# Patient Record
Sex: Female | Born: 1955 | Race: White | Hispanic: No | Smoking: Former smoker
Health system: Southern US, Community
[De-identification: ages and names within clinical notes are randomized; demographics above are authoritative.]

## PROBLEM LIST (undated history)

## (undated) DIAGNOSIS — E7800 Pure hypercholesterolemia, unspecified: Secondary | ICD-10-CM

## (undated) DIAGNOSIS — T7840XA Allergy, unspecified, initial encounter: Secondary | ICD-10-CM

## (undated) DIAGNOSIS — F419 Anxiety disorder, unspecified: Secondary | ICD-10-CM

## (undated) DIAGNOSIS — K219 Gastro-esophageal reflux disease without esophagitis: Secondary | ICD-10-CM

## (undated) DIAGNOSIS — L409 Psoriasis, unspecified: Secondary | ICD-10-CM

## (undated) DIAGNOSIS — M1990 Unspecified osteoarthritis, unspecified site: Secondary | ICD-10-CM

## (undated) DIAGNOSIS — F329 Major depressive disorder, single episode, unspecified: Secondary | ICD-10-CM

## (undated) HISTORY — DX: Allergy, unspecified, initial encounter: T78.40XA

## (undated) HISTORY — DX: Unspecified osteoarthritis, unspecified site: M19.90

## (undated) HISTORY — DX: Anxiety disorder, unspecified: F41.9

## (undated) HISTORY — PX: CHOLECYSTECTOMY: SHX55

## (undated) HISTORY — PX: HAND SURGERY: SHX662

## (undated) HISTORY — PX: CHOLECYSTECTOMY, LAPAROSCOPIC: SHX56

## (undated) HISTORY — PX: FOOT SURGERY: SHX648

---
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
## 2003-12-12 ENCOUNTER — Other Ambulatory Visit: Admission: RE | Admit: 2003-12-12 | Discharge: 2003-12-12 | Payer: Self-pay | Admitting: Obstetrics and Gynecology

## 2004-12-25 ENCOUNTER — Other Ambulatory Visit: Admission: RE | Admit: 2004-12-25 | Discharge: 2004-12-25 | Payer: Self-pay | Admitting: Obstetrics and Gynecology

## 2015-10-28 DIAGNOSIS — M6740 Ganglion, unspecified site: Secondary | ICD-10-CM | POA: Insufficient documentation

## 2015-10-28 DIAGNOSIS — L409 Psoriasis, unspecified: Secondary | ICD-10-CM | POA: Insufficient documentation

## 2015-10-28 DIAGNOSIS — E7800 Pure hypercholesterolemia, unspecified: Secondary | ICD-10-CM | POA: Insufficient documentation

## 2015-10-28 DIAGNOSIS — E669 Obesity, unspecified: Secondary | ICD-10-CM | POA: Insufficient documentation

## 2015-10-28 DIAGNOSIS — K219 Gastro-esophageal reflux disease without esophagitis: Secondary | ICD-10-CM

## 2015-10-28 DIAGNOSIS — F329 Major depressive disorder, single episode, unspecified: Secondary | ICD-10-CM | POA: Insufficient documentation

## 2015-10-28 DIAGNOSIS — E785 Hyperlipidemia, unspecified: Secondary | ICD-10-CM | POA: Insufficient documentation

## 2015-10-28 DIAGNOSIS — R748 Abnormal levels of other serum enzymes: Secondary | ICD-10-CM | POA: Insufficient documentation

## 2015-10-28 HISTORY — DX: Pure hypercholesterolemia, unspecified: E78.00

## 2015-10-28 HISTORY — DX: Gastro-esophageal reflux disease without esophagitis: K21.9

## 2015-10-28 HISTORY — DX: Psoriasis, unspecified: L40.9

## 2015-10-28 HISTORY — DX: Major depressive disorder, single episode, unspecified: F32.9

## 2015-10-29 DIAGNOSIS — Z0000 Encounter for general adult medical examination without abnormal findings: Secondary | ICD-10-CM | POA: Insufficient documentation

## 2016-04-28 ENCOUNTER — Ambulatory Visit (INDEPENDENT_AMBULATORY_CARE_PROVIDER_SITE_OTHER): Payer: Managed Care, Other (non HMO) | Admitting: Podiatry

## 2016-04-28 ENCOUNTER — Encounter: Payer: Self-pay | Admitting: Podiatry

## 2016-04-28 ENCOUNTER — Ambulatory Visit (HOSPITAL_BASED_OUTPATIENT_CLINIC_OR_DEPARTMENT_OTHER)
Admission: RE | Admit: 2016-04-28 | Discharge: 2016-04-28 | Disposition: A | Discharge status: Home / Self Care | Payer: Managed Care, Other (non HMO) | Source: Ambulatory Visit | Attending: Podiatry | Admitting: Podiatry

## 2016-04-28 DIAGNOSIS — M779 Enthesopathy, unspecified: Secondary | ICD-10-CM | POA: Diagnosis not present

## 2016-04-28 DIAGNOSIS — M2142 Flat foot [pes planus] (acquired), left foot: Secondary | ICD-10-CM | POA: Diagnosis not present

## 2016-04-28 DIAGNOSIS — M2141 Flat foot [pes planus] (acquired), right foot: Secondary | ICD-10-CM | POA: Insufficient documentation

## 2016-04-28 DIAGNOSIS — M19071 Primary osteoarthritis, right ankle and foot: Secondary | ICD-10-CM | POA: Diagnosis not present

## 2016-04-28 DIAGNOSIS — M899 Disorder of bone, unspecified: Secondary | ICD-10-CM | POA: Insufficient documentation

## 2016-04-28 DIAGNOSIS — R52 Pain, unspecified: Secondary | ICD-10-CM

## 2016-04-28 NOTE — Progress Notes (Signed)
   Subjective:    Patient ID: Caitlin Pitts, female    DOB: 03/27/1956, 60 y.o.   MRN: [id number]  HPI  60 year old female presents the also concerns her right ankle pain which didn't ongoing for about 10 years and she is pretty cc and 3 other doctors for this issue. She states of the first doctor recommend reconstructive surgery. The second Dr. Jeani Hawkingurney Aircast. She also had a steroid injection which she states that helps some as well as anti-inflammatories. She is also tried orthotics and she states that she does not want to do any further orthotics that she she could not fit correctly and they rub her skin. She does state that she has a history several years ago when her foot since that is when she had the onset of pain about 10 years ago. She denies having any history of an MRI. She has had neuroma surgery in 2015 and she states that she feels that she is walking on rocks.  Review of Systems  All other systems reviewed and are negative.      Objective:   Physical Exam General: AAO x3, NAD  Dermatological: Skin is warm, dry and supple bilateral. Nails x 10 are well manicured; remaining integument appears unremarkable at this time. There are no open sores, no preulcerative lesions, no rash or signs of infection present.  Vascular: Dorsalis Pedis artery and Posterior Tibial artery pedal pulses are 2/4 bilateral with immedate capillary fill time. Pedal hair growth present. There is no pain with calf compression, swelling, warmth, erythema.   Neruologic: Grossly intact via light touch bilateral. Vibratory intact via tuning fork bilateral. Protective threshold with Semmes Wienstein monofilament intact to all pedal sites bilateral.   Musculoskeletal: There is mild tenderness on the lateral aspect of the sinus tarsi and the right foot and there is no discomfort with subtalar joint range of motion. Ankle joint range of motion is intact. There is a decrease in medial arch height upon weightbearing.  Equinus is present. There is no area pinpoint bony tenderness or pain the vibratory sensation. MMT 5/5. There is prominence of the metatarsal heads plantarly with atrophy of the fat pad.  Gait: Unassisted, Nonantalgic.      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 -Discussed both conservative and surgical treatment options. At this time given her ongoing symptoms and multiple treatment without relief of symptoms recommend MRI. She'll to shop around for the cheapest MRI. I gave her different location that she can call and get a quote on price as she has a high deductible and when she gets the cheapest place that she can call us and let us know we will order the MRI for her. Also written her to call her insurance company. -Dispensed metatarsal offloading pads.  Ovid CurdMatthew Wagoner, DPM

## 2016-04-28 NOTE — Patient Instructions (Signed)
If you are shopping around for the best price for an MRI check out the following places: 1. [facility] on [address] [place] 2. [facility]  3. [facility] 4. [facility] on [address] in [place]

## 2016-04-30 ENCOUNTER — Other Ambulatory Visit: Payer: Self-pay

## 2016-05-08 ENCOUNTER — Telehealth: Payer: Self-pay | Admitting: *Deleted

## 2016-05-08 DIAGNOSIS — R52 Pain, unspecified: Secondary | ICD-10-CM

## 2016-05-08 DIAGNOSIS — M779 Enthesopathy, unspecified: Secondary | ICD-10-CM

## 2016-05-08 DIAGNOSIS — M2142 Flat foot [pes planus] (acquired), left foot: Secondary | ICD-10-CM

## 2016-05-08 DIAGNOSIS — M2141 Flat foot [pes planus] (acquired), right foot: Secondary | ICD-10-CM

## 2016-05-08 DIAGNOSIS — M19071 Primary osteoarthritis, right ankle and foot: Secondary | ICD-10-CM

## 2016-05-08 NOTE — Telephone Encounter (Addendum)
Pt states she would like to have her MRI at [facility] in [place], after shopping for prices all facilities are about the same cost. I told pt I would order at [facility] and once the pre-cert was completed she could schedule. Pt states understanding. Dr. Ardelle AntonWagoner ordered MRI of Right foot and ankle. Orders given to D. [facility]ows for [employer]. 05/29/2016-Pt called for instructions after having MRI. I spoke with [facility], she states finalized report can be faxed today.

## 2016-06-09 ENCOUNTER — Ambulatory Visit (INDEPENDENT_AMBULATORY_CARE_PROVIDER_SITE_OTHER): Payer: Managed Care, Other (non HMO) | Admitting: Podiatry

## 2016-06-09 ENCOUNTER — Encounter: Payer: Self-pay | Admitting: Podiatry

## 2016-06-09 DIAGNOSIS — M25571 Pain in right ankle and joints of right foot: Secondary | ICD-10-CM | POA: Diagnosis not present

## 2016-06-09 DIAGNOSIS — M899 Disorder of bone, unspecified: Secondary | ICD-10-CM

## 2016-06-09 DIAGNOSIS — M779 Enthesopathy, unspecified: Secondary | ICD-10-CM | POA: Diagnosis not present

## 2016-06-09 DIAGNOSIS — M19071 Primary osteoarthritis, right ankle and foot: Secondary | ICD-10-CM | POA: Diagnosis not present

## 2016-06-09 DIAGNOSIS — M949 Disorder of cartilage, unspecified: Secondary | ICD-10-CM

## 2016-06-09 NOTE — Progress Notes (Signed)
Subjective: 60 year old female presents the office today for follow-up evaluation discussed MRI results of her right foot. She states that her pain is about the same. She points the lateral aspect of the foot on the sinus tarsi which is the majority of symptoms. She states that her pain does not around she is currently not experiencing any pain. She states they seem to other physicians for this and she's had multiple treatments including previous custom orthotics to help increase her arch, physical therapy, injections, shoe changes without any significant relief of symptoms. The injections did help her daily Lasix for a couple weeks. She points was tarsi where she is the majority of symptoms. Denies any systemic complaints such as fevers, chills, nausea, vomiting. No acute changes since last appointment, and no other complaints at this time.   Objective: AAO x3, NAD DP/PT pulses palpable bilaterally, CRT less than 3 seconds This tenderness palpation along the lateral aspect of the right foot on the sinus tarsi. There is no pain or crepitation subtalar joint range of motion today. There is no pain with ankle joint is no restriction with ankle joint range of motion. There is no pain of the posterior aspect of the foot with range of motion as well. There is no other specific area pinpoint bony tenderness or pain the vibratory sensation. There is a decrease in medial arch height upon weightbearing. No other areas of tenderness identified at this time. No open lesions or pre-ulcerative lesions.  No pain with calf compression, swelling, warmth, erythema  Assessment: 60 year old female with right foot sinus tarsi syndrome/capsulitis  Plan: -All treatment options discussed with the patient including all alternatives, risks, complications.  -MRI results were discussed and reviewed with the patient. -This point discussed both conservative and surgical treatment options. I am not convinced that doing ankle  arthroscopy and removing the os trigonum is wanting to limit her symptoms and based on the discussion with her she does feel the same way. Discussed with her other treatment options. I believe that she would likely benefit from an orthotic with a deep heel cup to help control her rear foot motion. She states that she when she wears a shoe that controls her heel that does better. I discussed with her possible Mezzo brace but she was hesitant. We will go ahead and do a custom molded insert with a deep heel cup to help control her rear foot motion. She was scanned for orthotics and [facility]. She agrees with plan. -Patient encouraged to call the office with any questions, concerns, change in symptoms.   MRI results (preformed at [facility]): -Findings consistent with posterior ankle impingement, os trigonum syndrome -Prominent osteochondral lesion the lateral talar dome with associated subchondral cyst formation and marrow edema. Subchondral bone of the lesion is flattened. No fragments stability. -Subtalar joint osteoarthritis -Sinus tarsi syndrome   Ovid CurdMatthew Wagoner, DPM

## 2016-06-17 ENCOUNTER — Encounter: Payer: Self-pay | Admitting: Podiatry

## 2016-07-07 ENCOUNTER — Encounter: Payer: Self-pay | Admitting: Podiatry

## 2016-07-07 ENCOUNTER — Ambulatory Visit (INDEPENDENT_AMBULATORY_CARE_PROVIDER_SITE_OTHER): Payer: Managed Care, Other (non HMO) | Admitting: Podiatry

## 2016-07-07 DIAGNOSIS — M949 Disorder of cartilage, unspecified: Secondary | ICD-10-CM

## 2016-07-07 DIAGNOSIS — M899 Disorder of bone, unspecified: Secondary | ICD-10-CM

## 2016-07-07 DIAGNOSIS — M779 Enthesopathy, unspecified: Secondary | ICD-10-CM

## 2016-07-07 DIAGNOSIS — M19071 Primary osteoarthritis, right ankle and foot: Secondary | ICD-10-CM

## 2016-07-07 NOTE — Patient Instructions (Signed)

## 2016-07-07 NOTE — Progress Notes (Signed)
Patient presents today to [facility]. Declined appointment by myself and was seen by Hadley PenLisa Cox, CMA. Oral and written break-in instructions discussed. Follow-up in 4 weeks.

## 2017-11-08 ENCOUNTER — Encounter: Payer: Self-pay | Admitting: Podiatry

## 2017-11-08 ENCOUNTER — Ambulatory Visit (INDEPENDENT_AMBULATORY_CARE_PROVIDER_SITE_OTHER): Payer: Managed Care, Other (non HMO) | Admitting: Podiatry

## 2017-11-08 ENCOUNTER — Ambulatory Visit (INDEPENDENT_AMBULATORY_CARE_PROVIDER_SITE_OTHER): Payer: Managed Care, Other (non HMO)

## 2017-11-08 DIAGNOSIS — M19071 Primary osteoarthritis, right ankle and foot: Secondary | ICD-10-CM | POA: Diagnosis not present

## 2017-11-08 DIAGNOSIS — M779 Enthesopathy, unspecified: Secondary | ICD-10-CM | POA: Diagnosis not present

## 2017-11-08 MED ORDER — METHYLPREDNISOLONE 4 MG PO TBPK: ORAL_TABLET | ORAL | 0 refills | Status: DC

## 2017-11-09 ENCOUNTER — Telehealth: Payer: Self-pay | Admitting: Podiatry

## 2017-11-09 NOTE — Telephone Encounter (Signed)
I saw Dr. Ardelle AntonWagoner yesterday and I have some questions. He mentioned a brace and he did not give me one so I don't know what to get. Also, I was wondering if there was anyway he would be able to know if this was psoriatic arthritis or just plain arthritis? The best number to reach me is [phone]. Thank you.

## 2017-11-10 NOTE — Telephone Encounter (Signed)
We discussed a tri lock for the left foot while it was inflamed. She may have one for the other side. Not sure why she didn't get it.   I do not think this is necessarily related to the psoriatic arthritis but it is hard to determine if it is psoratic arthritis vs. Osteoarthritis. We can see this type of arthritis in general and is not associated with an autoimmune disorder.

## 2017-11-10 NOTE — Telephone Encounter (Signed)
I discussed trilock brace and that I would be happy to help fit and discuss use. I told pt of Dr.Wagoner's observation concerning psoriatic arthritis and general arthritis. Pt states she was just curious since she has psoriasis.

## 2017-11-10 NOTE — Progress Notes (Addendum)
Subjective: Lorel Monacoambria presents the office today for concerns of the left foot starting to cause pain to the right foot was.  She states that she still gets pain to the right foot as well pain in the left foot is starting more recently.  She denies any recent injury or trauma she denies any swelling or redness.  She states that she has had minimal swelling.  She is been wearing the inserts but she states that she stopped as they were not helping.  She has no other concerns today. Denies any systemic complaints such as fevers, chills, nausea, vomiting. No acute changes since last appointment, and no other complaints at this time.   Objective: AAO x3, NAD DP/PT pulses palpable bilaterally, CRT less than 3 seconds There is tenderness in the left foot on lateral aspect the foot on sinus tarsi.  There is no pain or crepitation with MPJ range of motion.  There is very minimal edema there is no erythema or increase in warmth.  No pain with ankle joint range of motion.  No area pinpoint bony tenderness pain to vibratory sensation bilaterally.  She still has tenderness on the right lateral foot on sinus tarsi the left side is more symptomatic today. There is no erythema or warmth.  No open lesions or pre-ulcerative lesions.  No pain with calf compression, swelling, warmth, erythema  Assessment: Capsulitis subtalar joint bilaterally  Plan: -All treatment options discussed with the patient including all alternatives, risks, complications.  -X-rays were obtained and reviewed in the left foot.  Mild arthritic changes to the subtalar joint.  There is no evidence of acute fracture or stress fracture identified today. -She has a steroid injection she wishes.  Prescribed a Medrol Dosepak.  Also discussed her orthotics.  I want her to bring the orthotics back and see if we can modify the become more comfortable for her and that the inside of her shoes.  Also discussed physical therapy and prescription for [facility]  physical therapy was written today. Discussed an ankle brace on the left foot.  -Patient encouraged to call the office with any questions, concerns, change in symptoms.   Vivi BarrackMatthew R Galadriel Shroff DPM

## 2017-12-06 DIAGNOSIS — M779 Enthesopathy, unspecified: Secondary | ICD-10-CM | POA: Diagnosis not present

## 2017-12-06 NOTE — Telephone Encounter (Signed)
I fitted pt in TriLock brace 9 1/2 -10 size for the right foot, without sock, and instructed pt to wear the brace with socks.

## 2017-12-23 ENCOUNTER — Ambulatory Visit (INDEPENDENT_AMBULATORY_CARE_PROVIDER_SITE_OTHER): Payer: Managed Care, Other (non HMO) | Admitting: Podiatry

## 2017-12-23 ENCOUNTER — Encounter: Payer: Self-pay | Admitting: Podiatry

## 2017-12-23 DIAGNOSIS — M7751 Other enthesopathy of right foot: Secondary | ICD-10-CM

## 2017-12-23 DIAGNOSIS — M899 Disorder of bone, unspecified: Secondary | ICD-10-CM

## 2017-12-23 DIAGNOSIS — M779 Enthesopathy, unspecified: Secondary | ICD-10-CM

## 2017-12-23 DIAGNOSIS — M949 Disorder of cartilage, unspecified: Secondary | ICD-10-CM | POA: Diagnosis not present

## 2017-12-23 NOTE — Patient Instructions (Signed)

## 2017-12-27 ENCOUNTER — Telehealth: Payer: Self-pay | Admitting: *Deleted

## 2017-12-27 NOTE — Telephone Encounter (Signed)
"  I need to schedule my surgery with Dr. Ardelle Anton, please."  Do you have a date in mind?  "I'd like to do it after the fourth of July if possible."  That date is available.  I will get the surgery scheduled.  You can register on-line with the surgery center.  Choose a method of communication you would like to use between you and the surgery center staff.  It can be via email, text message, or a phone call.  "Alright I will.  Thank you so much."

## 2017-12-27 NOTE — Progress Notes (Signed)
Subjective: 62 year old female presents the office today for concerns of ongoing chronic ankle pain.  She states that she had similar pains before she started seeing me and this is been ongoing for couple years.  At this point she like to proceed with surgery to going to look at the ankle joint to see what is going on and she is had ongoing chronic pain to the ankle joint.  She has pain mostly to the anterior lateral aspect of the ankle joint where she points to.  She is attempted offloading, bracing, orthotics as well as steroids without any significant improvement. Denies any systemic complaints such as fevers, chills, nausea, vomiting. No acute changes since last appointment, and no other complaints at this time.   Objective: AAO x3, NAD DP/PT pulses palpable bilaterally, CRT less than 3 seconds There is tenderness to palpation to the anterior lateral aspect of her ankle on the anterior ankle joint line.  There is also mild discomfort on the sinus tarsi which continues.  There is mild edema to the lateral aspect of the ankle.  There is no other areas of tenderness.  There is no pain or crepitation with ankle joint range of motion.  No open lesions or pre-ulcerative lesions.  No pain with calf compression, swelling, warmth, erythema  Assessment: Osteochondral lesion of the right ankle, subtalar joint capsulitis  Plan: -All treatment options discussed with the patient including all alternatives, risks, complications.  -I reviewed the MRI with her from prior exam.  Also discussed the new MRI but she wishes to hold off on this and at this point I do not think is to change the surgery.  We discussed with her ankle arthroscopy with microfracture of the ankle joint.  We discussed the surgery as well as the postoperative course.  We had a long discussion in regard to this may not limiting her symptoms she may have ongoing symptoms or pain or even worsening of pain after the surgery and she understands this  and she wished to proceed. -The incision placement as well as the postoperative course was discussed with the patient. I discussed risks of the surgery which include, but not limited to, infection, bleeding, pain, swelling, need for further surgery, delayed or nonhealing, painful or ugly scar, numbness or sensation changes, over/under correction, recurrence, transfer lesions, further deformity, hardware failure, DVT/PE, loss of toe/foot. Patient understands these risks and wishes to proceed with surgery. The surgical consent was reviewed with the patient all 3 pages were signed. No promises or guarantees were given to the outcome of the procedure. All questions were answered to the best of my ability. Before the surgery the patient was encouraged to call the office if there is any further questions. The surgery will be performed at the [facility] on an outpatient basis. -Cam boot was dispensed for postoperative use -Patient encouraged to call the office with any questions, concerns, change in symptoms.   Vivi Barrack DPM

## 2018-02-11 ENCOUNTER — Telehealth: Payer: Self-pay | Admitting: *Deleted

## 2018-02-11 NOTE — Telephone Encounter (Signed)
Pt's post op visits have been cancelled.

## 2018-02-11 NOTE — Telephone Encounter (Signed)
"  I'm really faced with a predicament.  My ankle is feeling so much better.  I wore the brace consistently for six weeks as instructed.  My foot feels so much better.  I don't wear it that much anymore, just occasionally.  I want to cancel my surgery and see how it goes.  Is that okay?"  It's great that your ankle is feeling better.  Our main concern is to ensure you are improving.  If you feel your ankle is starting to flare up again, we can conservatively treat you.  So give us a call if you have any problems.   "Can I bring the boot back?"  You can bring it back as long as it hasn't been used.  "I haven't worn it, it's still in the plastic back that it came in.  Thank you so much for your help."  I called Renee at the surgical center and canceled the surgery.

## 2018-02-28 ENCOUNTER — Encounter: Payer: Managed Care, Other (non HMO) | Admitting: Podiatry

## 2018-03-10 ENCOUNTER — Encounter: Payer: Managed Care, Other (non HMO) | Admitting: Podiatry

## 2019-03-01 ENCOUNTER — Encounter: Payer: Self-pay | Admitting: Family Medicine

## 2019-03-01 ENCOUNTER — Ambulatory Visit (INDEPENDENT_AMBULATORY_CARE_PROVIDER_SITE_OTHER): Payer: Managed Care, Other (non HMO) | Admitting: Family Medicine

## 2019-03-01 DIAGNOSIS — E669 Obesity, unspecified: Secondary | ICD-10-CM

## 2019-03-01 DIAGNOSIS — L409 Psoriasis, unspecified: Secondary | ICD-10-CM

## 2019-03-01 DIAGNOSIS — F3341 Major depressive disorder, recurrent, in partial remission: Secondary | ICD-10-CM | POA: Diagnosis not present

## 2019-03-01 NOTE — Assessment & Plan Note (Signed)
-  Current flare managed by dermatology with increase in MTX.

## 2019-03-01 NOTE — Assessment & Plan Note (Signed)
-  Stable with current medications, will plan to continue.  

## 2019-03-01 NOTE — Progress Notes (Signed)
Caitlin Pitts - 63 y.o. female MRN [id number]  Date of birth: 07/26/1956  Subjective Chief Complaint  Patient presents with  . Establish Care    NP/ depression/Anciety/Psorisis flar up/Obesity    HPI Caitlin Pitts is a 63 y.o. female with history of psoriasis, depression and anxiety here today for initial visit with new pcp.  She reports that she is having a flare of psoriasis but otherwise is doing ok.  She is followed by Dr. Tamala Julian at [facility].  She increases her methotrexate when she has a flare of psoriasis.    Reports struggling with weight for several years.  Has tried phentermine but had severe palpitations with this.  Hasn't had much improvement with bupropion.  She admits to fairly sedentary lifestyle.    Her anxiety and depression are well managed with bupropion and lexapro.  She does take valium as needed if flying.    ROS:  A comprehensive ROS was completed and negative except as noted per HPI  Allergies  Allergen Reactions  . Aspirin Swelling  . Desvenlafaxine Rash  . Penicillins Rash  . Phentermine Rash    History reviewed. No pertinent past medical history.  History reviewed. No pertinent surgical history.  Social History   Socioeconomic History  . Marital status: Divorced    Spouse name: Not on file  . Number of children: Not on file  . Years of education: Not on file  . Highest education level: Not on file  Occupational History  . Not on file  Social Needs  . Financial resource strain: Not on file  . Food insecurity    Worry: Not on file    Inability: Not on file  . Transportation needs    Medical: Not on file    Non-medical: Not on file  Tobacco Use  . Smoking status: Former Research scientist (life sciences)  . Smokeless tobacco: Never Used  Substance and Sexual Activity  . Alcohol use: Not Currently  . Drug use: Never  . Sexual activity: Not Currently  Lifestyle  . Physical activity    Days per week: Not on file    Minutes per session: Not on file   . Stress: Not on file  Relationships  . Social Herbalist on phone: Not on file    Gets together: Not on file    Attends religious service: Not on file    Active member of club or organization: Not on file    Attends meetings of clubs or organizations: Not on file    Relationship status: Not on file  Other Topics Concern  . Not on file  Social History Narrative  . Not on file    Family History  Problem Relation Age of Onset  . Migraines Mother   . Hypercholesterolemia Mother     Health Maintenance  Topic Date Due  . Hepatitis C Screening  08/29/55  . HIV Screening  03/21/1971  . TETANUS/TDAP  03/21/1975  . PAP SMEAR-Modifier  03/20/1977  . MAMMOGRAM  03/20/2006  . COLONOSCOPY  03/20/2006  . INFLUENZA VACCINE  03/18/2019    ----------------------------------------------------------------------------------------------------------------------------------------------------------------------------------------------------------------- Physical Exam BP 98/72   Pulse 76   Temp 97.9 F (36.6 C) (Oral)   Resp 16   Ht 5' 4.25" (1.632 m)   Wt 225 lb (102.1 kg)   SpO2 98%   BMI 38.32 kg/m   Physical Exam Constitutional:      General: She is not in acute distress.    Appearance: She is  obese.  HENT:     Head: Normocephalic and atraumatic.     Mouth/Throat:     Mouth: Mucous membranes are moist.  Eyes:     General: No scleral icterus. Neck:     Musculoskeletal: Neck supple.  Cardiovascular:     Rate and Rhythm: Normal rate and regular rhythm.  Pulmonary:     Effort: Pulmonary effort is normal.     Breath sounds: Normal breath sounds.  Skin:    General: Skin is warm and dry.  Neurological:     General: No focal deficit present.     Mental Status: She is alert.  Psychiatric:        Mood and Affect: Mood normal.        Behavior: Behavior normal.      ------------------------------------------------------------------------------------------------------------------------------------------------------------------------------------------------------------------- Assessment and Plan  Psoriasis -Current flare managed by dermatology with increase in MTX.    Major depression -Stable with current medications, will plan to continue.   Obesity (BMI 35.0-39.9 without comorbidity) -Recommend increased exercise starting with walking as well as a diet rich in fruits and vegetables and low in processed foods.

## 2019-03-01 NOTE — Patient Instructions (Addendum)
-  Great to meet you! -Please follow up with me for an annual exam.

## 2019-03-01 NOTE — Assessment & Plan Note (Signed)
-  Recommend increased exercise starting with walking as well as a diet rich in fruits and vegetables and low in processed foods.

## 2019-03-17 ENCOUNTER — Telehealth: Payer: Self-pay

## 2019-03-17 NOTE — Telephone Encounter (Signed)
Questions for Screening COVID-19  Symptom onset: n/a  Travel or Contacts: no  During this illness, did/does the patient experience any of the following symptoms? Fever >100.30F []   Yes [x]   No []   Unknown Subjective fever (felt feverish) []   Yes [x]   No []   Unknown Chills []   Yes [x]   No []   Unknown Muscle aches (myalgia) []   Yes [x]   No []   Unknown Runny nose (rhinorrhea) []   Yes [x]   No []   Unknown Sore throat []   Yes [x]   No []   Unknown Cough (new onset or worsening of chronic cough) []   Yes [x]   No []   Unknown Shortness of breath (dyspnea) []   Yes []   No []   Unknown Nausea or vomiting []   Yes [x]   No []   Unknown Headache []   Yes [x]   No []   Unknown Abdominal pain  []   Yes [x]   No []   Unknown Diarrhea (?3 loose/looser than normal stools/24hr period) []   Yes [x]   No []   Unknown Other, specify:  Patient risk factors: Smoker? []   Current []   Former []   Never If female, currently pregnant? []   Yes []   No  Patient Active Problem List   Diagnosis Date Noted  . Annual physical exam 10/29/2015  . Elevated lipase 10/28/2015  . Esophageal reflux 10/28/2015  . Ganglion of tendon sheath 10/28/2015  . Major depression 10/28/2015  . Obesity (BMI 35.0-39.9 without comorbidity) 10/28/2015  . Psoriasis 10/28/2015  . Pure hypercholesterolemia 10/28/2015    Plan:  []   High risk for COVID-19 with red flags go to ED (with CP, SOB, weak/lightheaded, or fever > 101.5). Call ahead.  []   High risk for COVID-19 but stable. Inform provider and coordinate time for [facility] visit.   []   No red flags but URI signs or symptoms okay for [facility] visit.

## 2019-03-20 ENCOUNTER — Other Ambulatory Visit: Payer: Self-pay

## 2019-03-20 ENCOUNTER — Ambulatory Visit (INDEPENDENT_AMBULATORY_CARE_PROVIDER_SITE_OTHER): Payer: Managed Care, Other (non HMO)

## 2019-03-20 ENCOUNTER — Encounter: Payer: Self-pay | Admitting: Family Medicine

## 2019-03-20 ENCOUNTER — Ambulatory Visit (INDEPENDENT_AMBULATORY_CARE_PROVIDER_SITE_OTHER): Payer: Managed Care, Other (non HMO) | Admitting: Family Medicine

## 2019-03-20 VITALS — BP 116/82 | HR 63 | Temp 97.7°F | Ht 64.25 in | Wt 225.2 lb

## 2019-03-20 DIAGNOSIS — Z23 Encounter for immunization: Secondary | ICD-10-CM | POA: Diagnosis not present

## 2019-03-20 DIAGNOSIS — M25561 Pain in right knee: Secondary | ICD-10-CM

## 2019-03-20 DIAGNOSIS — E7800 Pure hypercholesterolemia, unspecified: Secondary | ICD-10-CM

## 2019-03-20 DIAGNOSIS — Z0000 Encounter for general adult medical examination without abnormal findings: Secondary | ICD-10-CM

## 2019-03-20 DIAGNOSIS — M23205 Derangement of unspecified medial meniscus due to old tear or injury, unspecified knee: Secondary | ICD-10-CM | POA: Insufficient documentation

## 2019-03-20 LAB — CBC WITH DIFFERENTIAL/PLATELET
Basophils Absolute: 0 10*3/uL (ref 0.0–0.1)
Basophils Relative: 1.1 % (ref 0.0–3.0)
Eosinophils Absolute: 0.1 10*3/uL (ref 0.0–0.7)
Eosinophils Relative: 3.2 % (ref 0.0–5.0)
HCT: 36.9 % (ref 36.0–46.0)
Hemoglobin: 12.5 g/dL (ref 12.0–15.0)
Lymphocytes Relative: 40.9 % (ref 12.0–46.0)
Lymphs Abs: 1.8 10*3/uL (ref 0.7–4.0)
MCHC: 33.8 g/dL (ref 30.0–36.0)
MCV: 96.1 fl (ref 78.0–100.0)
Monocytes Absolute: 0.2 10*3/uL (ref 0.1–1.0)
Monocytes Relative: 5.2 % (ref 3.0–12.0)
Neutro Abs: 2.2 10*3/uL (ref 1.4–7.7)
Neutrophils Relative %: 49.6 % (ref 43.0–77.0)
Platelets: 210 10*3/uL (ref 150.0–400.0)
RBC: 3.84 Mil/uL — ABNORMAL LOW (ref 3.87–5.11)
RDW: 13.5 % (ref 11.5–15.5)
WBC: 4.3 10*3/uL (ref 4.0–10.5)

## 2019-03-20 LAB — LIPID PANEL
Cholesterol: 207 mg/dL — ABNORMAL HIGH (ref 0–200)
HDL: 63.5 mg/dL (ref >39.00)
LDL Cholesterol: 115 mg/dL — ABNORMAL HIGH (ref 0–99)
NonHDL: 143.37
Total CHOL/HDL Ratio: 3
Triglycerides: 140 mg/dL (ref 0.0–149.0)
VLDL: 28 mg/dL (ref 0.0–40.0)

## 2019-03-20 LAB — COMPREHENSIVE METABOLIC PANEL
ALT: 31 U/L (ref 0–35)
AST: 25 U/L (ref 0–37)
Albumin: 4.3 g/dL (ref 3.5–5.2)
Alkaline Phosphatase: 50 U/L (ref 39–117)
BUN: 20 mg/dL (ref 6–23)
CO2: 24 mEq/L (ref 19–32)
Calcium: 9.4 mg/dL (ref 8.4–10.5)
Chloride: 106 mEq/L (ref 96–112)
Creatinine, Ser: 0.9 mg/dL (ref 0.40–1.20)
GFR: 63.24 mL/min (ref >60.00)
Glucose, Bld: 94 mg/dL (ref 70–99)
Potassium: 4.5 mEq/L (ref 3.5–5.1)
Sodium: 138 mEq/L (ref 135–145)
Total Bilirubin: 0.5 mg/dL (ref 0.2–1.2)
Total Protein: 7.1 g/dL (ref 6.0–8.3)

## 2019-03-20 LAB — TSH: TSH: 1.42 u[IU]/mL (ref 0.35–4.50)

## 2019-03-20 NOTE — Assessment & Plan Note (Signed)
-  Xrays requested to eval for R knee pain. Discussed referral to sports med if xrays are negative.

## 2019-03-20 NOTE — Progress Notes (Signed)
Caitlin Pitts - 63 y.o. female MRN [id number]  Date of birth: 1955-11-25  Subjective Chief Complaint  Patient presents with  . Annual Exam    CPE- Not fasting/ wants tdap/ pap and mammo-jan/ colonoscopy 3 years goes every 5     HPI Caitlin Pitts is a 63 y.o. female with history of psoriasis, depression and elevated cholesterol here today for annual exam.  She complains of R knee pain today as well.  Reports that she was walking her dog a few weeks ago and when she was picking up dog poop her dog started to chase another dog causing her to twist her knee.  She feels like her knee has been swollen and a bit unstable since that time.  It feels "tight" to bend the knee.  She denies locking sensation.  In regards to health maintenance she is up to date on colon cancer screening, pap and mammogram. She is followed at [facility] by Dr. Corinna Capra for her female care.   She would like to have updated Tdap today.   Review of Systems  Constitutional: Negative for chills, fever, malaise/fatigue and weight loss.  HENT: Negative for congestion, ear pain and sore throat.   Eyes: Negative for blurred vision, double vision and pain.  Respiratory: Negative for cough and shortness of breath.   Cardiovascular: Negative for chest pain and palpitations.  Gastrointestinal: Negative for abdominal pain, blood in stool, constipation, heartburn and nausea.  Genitourinary: Negative for dysuria and urgency.  Musculoskeletal: Negative for joint pain and myalgias.  Neurological: Negative for dizziness and headaches.  Endo/Heme/Allergies: Does not bruise/bleed easily.  Psychiatric/Behavioral: Negative for depression. The patient is not nervous/anxious and does not have insomnia.      Allergies  Allergen Reactions  . Aspirin Swelling  . Desvenlafaxine Rash  . Penicillins Rash  . Phentermine Rash    Past Medical History:  Diagnosis Date  . Esophageal reflux 10/28/2015  . Major depression 10/28/2015  .  Psoriasis 10/28/2015  . Pure hypercholesterolemia 10/28/2015    Past Surgical History:  Procedure Laterality Date  . CHOLECYSTECTOMY, LAPAROSCOPIC    . FOOT SURGERY    . HAND SURGERY      Social History   Socioeconomic History  . Marital status: Divorced    Spouse name: Not on file  . Number of children: Not on file  . Years of education: Not on file  . Highest education level: Not on file  Occupational History  . Not on file  Social Needs  . Financial resource strain: Not on file  . Food insecurity    Worry: Not on file    Inability: Not on file  . Transportation needs    Medical: Not on file    Non-medical: Not on file  Tobacco Use  . Smoking status: Former Research scientist (life sciences)  . Smokeless tobacco: Never Used  Substance and Sexual Activity  . Alcohol use: Not Currently  . Drug use: Never  . Sexual activity: Not Currently  Lifestyle  . Physical activity    Days per week: Not on file    Minutes per session: Not on file  . Stress: Not on file  Relationships  . Social Herbalist on phone: Not on file    Gets together: Not on file    Attends religious service: Not on file    Active member of club or organization: Not on file    Attends meetings of clubs or organizations: Not on file  Relationship status: Not on file  Other Topics Concern  . Not on file  Social History Narrative  . Not on file    Family History  Problem Relation Age of Onset  . Migraines Mother   . Hypercholesterolemia Mother   . Cancer Mother   . Cancer Maternal Grandmother        lungcancer  . Cancer Paternal Grandmother        colon cancer    Health Maintenance  Topic Date Due  . Hepatitis C Screening  03/03/1956  . HIV Screening  03/21/1971  . TETANUS/TDAP  03/21/1975  . PAP SMEAR-Modifier  03/20/1977  . MAMMOGRAM  03/20/2006  . INFLUENZA VACCINE  03/18/2019  . COLONOSCOPY  07/31/2025     ----------------------------------------------------------------------------------------------------------------------------------------------------------------------------------------------------------------- Physical Exam BP 116/82   Pulse 63   Temp 97.7 F (36.5 C) (Oral)   Ht 5' 4.25" (1.632 m)   Wt 225 lb 3.2 oz (102.2 kg)   SpO2 98%   BMI 38.36 kg/m   Physical Exam Constitutional:      General: She is not in acute distress. HENT:     Head: Normocephalic and atraumatic.     Nose: Nose normal.  Eyes:     General: No scleral icterus.    Conjunctiva/sclera: Conjunctivae normal.  Neck:     Musculoskeletal: Normal range of motion and neck supple.     Thyroid: No thyromegaly.  Cardiovascular:     Rate and Rhythm: Normal rate and regular rhythm.     Heart sounds: Normal heart sounds.  Pulmonary:     Effort: Pulmonary effort is normal.     Breath sounds: Normal breath sounds.  Abdominal:     General: Bowel sounds are normal. There is no distension.     Palpations: Abdomen is soft.     Tenderness: There is no abdominal tenderness. There is no guarding.  Musculoskeletal: Normal range of motion.     Comments: R knee without obvious effusion.  She has mild ttp along medial knee.  ROM is good.  ACL/PCL, MCL and LCL without laxity.    Lymphadenopathy:     Cervical: No cervical adenopathy.  Skin:    General: Skin is warm and dry.     Findings: No rash.  Neurological:     Mental Status: She is alert and oriented to person, place, and time.     Cranial Nerves: No cranial nerve deficit.     Coordination: Coordination normal.  Psychiatric:        Behavior: Behavior normal.     ------------------------------------------------------------------------------------------------------------------------------------------------------------------------------------------------------------------- Assessment and Plan  Acute pain of right knee -Xrays requested to eval for R knee pain.  Discussed referral to sports med if xrays are negative.   Annual physical exam Well adult Orders Placed This Encounter  Procedures  . DG Knee Complete 4 Views Right    Standing Status:   Future    Standing Expiration Date:   05/19/2020    Order Specific Question:   Reason for Exam (SYMPTOM  OR DIAGNOSIS REQUIRED)    Answer:   Right knee pain    Order Specific Question:   Preferred imaging location?    Answer:   Internal    Order Specific Question:   Radiology Contrast Protocol - do NOT remove file path    Answer:   \\[email or web address]\Radiant\DXFluoroContrastProtocols.pdf  . Tdap vaccine greater than or equal to 7yo IM  . Comp Met (CMET)  . CBC w/Diff  . Lipid Profile  . TSH  Screenings:  UTD Immunizations: Tdap Anticipatory guidance/Risk factor reduction:  Recommendations per AVS.

## 2019-03-20 NOTE — Patient Instructions (Signed)
Happy Birthday!!  Hope you have a great one!   Preventive Care 56-63 Years Old, Female Preventive care refers to visits with your health care provider and lifestyle choices that can promote health and wellness. This includes:  A yearly physical exam. This may also be called an annual well check.  Regular dental visits and eye exams.  Immunizations.  Screening for certain conditions.  Healthy lifestyle choices, such as eating a healthy diet, getting regular exercise, not using drugs or products that contain nicotine and tobacco, and limiting alcohol use. What can I expect for my preventive care visit? Physical exam Your health care provider will check your:  Height and weight. This may be used to calculate body mass index (BMI), which tells if you are at a healthy weight.  Heart rate and blood pressure.  Skin for abnormal spots. Counseling Your health care provider may ask you questions about your:  Alcohol, tobacco, and drug use.  Emotional well-being.  Home and relationship well-being.  Sexual activity.  Eating habits.  Work and work Statistician.  Method of birth control.  Menstrual cycle.  Pregnancy history. What immunizations do I need?  Influenza (flu) vaccine  This is recommended every year. Tetanus, diphtheria, and pertussis (Tdap) vaccine  You may need a Td booster every 10 years. Varicella (chickenpox) vaccine  You may need this if you have not been vaccinated. Zoster (shingles) vaccine  You may need this after age 15. Measles, mumps, and rubella (MMR) vaccine  You may need at least one dose of MMR if you were born in 1957 or later. You may also need a second dose. Pneumococcal conjugate (PCV13) vaccine  You may need this if you have certain conditions and were not previously vaccinated. Pneumococcal polysaccharide (PPSV23) vaccine  You may need one or two doses if you smoke cigarettes or if you have certain conditions. Meningococcal  conjugate (MenACWY) vaccine  You may need this if you have certain conditions. Hepatitis A vaccine  You may need this if you have certain conditions or if you travel or work in places where you may be exposed to hepatitis A. Hepatitis B vaccine  You may need this if you have certain conditions or if you travel or work in places where you may be exposed to hepatitis B. Haemophilus influenzae type b (Hib) vaccine  You may need this if you have certain conditions. Human papillomavirus (HPV) vaccine  If recommended by your health care provider, you may need three doses over 6 months. You may receive vaccines as individual doses or as more than one vaccine together in one shot (combination vaccines). Talk with your health care provider about the risks and benefits of combination vaccines. What tests do I need? Blood tests  Lipid and cholesterol levels. These may be checked every 5 years, or more frequently if you are over 26 years old.  Hepatitis C test.  Hepatitis B test. Screening  Lung cancer screening. You may have this screening every year starting at age 86 if you have a 30-pack-year history of smoking and currently smoke or have quit within the past 15 years.  Colorectal cancer screening. All adults should have this screening starting at age 8 and continuing until age 18. Your health care provider may recommend screening at age 75 if you are at increased risk. You will have tests every 1-10 years, depending on your results and the type of screening test.  Diabetes screening. This is done by checking your blood sugar (glucose) after you  have not eaten for a while (fasting). You may have this done every 1-3 years.  Mammogram. This may be done every 1-2 years. Talk with your health care provider about when you should start having regular mammograms. This may depend on whether you have a family history of breast cancer.  BRCA-related cancer screening. This may be done if you have a  family history of breast, ovarian, tubal, or peritoneal cancers.  Pelvic exam and Pap test. This may be done every 3 years starting at age 27. Starting at age 73, this may be done every 5 years if you have a Pap test in combination with an HPV test. Other tests  Sexually transmitted disease (STD) testing.  Bone density scan. This is done to screen for osteoporosis. You may have this scan if you are at high risk for osteoporosis. Follow these instructions at home: Eating and drinking  Eat a diet that includes fresh fruits and vegetables, whole grains, lean protein, and low-fat dairy.  Take vitamin and mineral supplements as recommended by your health care provider.  Do not drink alcohol if: ? Your health care provider tells you not to drink. ? You are pregnant, may be pregnant, or are planning to become pregnant.  If you drink alcohol: ? Limit how much you have to 0-1 drink a day. ? Be aware of how much alcohol is in your drink. In the [place] one drink equals one 12 oz bottle of beer (355 mL), one 5 oz glass of wine (148 mL), or one 1 oz glass of hard liquor (44 mL). Lifestyle  Take daily care of your teeth and gums.  Stay active. Exercise for at least 30 minutes on 5 or more days each week.  Do not use any products that contain nicotine or tobacco, such as cigarettes, e-cigarettes, and chewing tobacco. If you need help quitting, ask your health care provider.  If you are sexually active, practice safe sex. Use a condom or other form of birth control (contraception) in order to prevent pregnancy and STIs (sexually transmitted infections).  If told by your health care provider, take low-dose aspirin daily starting at age 39. What's next?  Visit your health care provider once a year for a well check visit.  Ask your health care provider how often you should have your eyes and teeth checked.  Stay up to date on all vaccines. This information is not intended to replace advice given  to you by your health care provider. Make sure you discuss any questions you have with your health care provider. Document Released: 08/30/2015 Document Revised: 04/14/2018 Document Reviewed: 04/14/2018 Elsevier Patient Education  2020 [employer].

## 2019-03-20 NOTE — Assessment & Plan Note (Signed)
Well adult Orders Placed This Encounter  Procedures  . DG Knee Complete 4 Views Right    Standing Status:   Future    Standing Expiration Date:   05/19/2020    Order Specific Question:   Reason for Exam (SYMPTOM  OR DIAGNOSIS REQUIRED)    Answer:   Right knee pain    Order Specific Question:   Preferred imaging location?    Answer:   Internal    Order Specific Question:   Radiology Contrast Protocol - do NOT remove file path    Answer:   \\[email or web address]\Radiant\DXFluoroContrastProtocols.pdf  . Tdap vaccine greater than or equal to 7yo IM  . Comp Met (CMET)  . CBC w/Diff  . Lipid Profile  . TSH  Screenings:  UTD Immunizations: Tdap Anticipatory guidance/Risk factor reduction:  Recommendations per AVS.

## 2019-03-23 ENCOUNTER — Telehealth: Payer: Self-pay

## 2019-03-23 DIAGNOSIS — M25561 Pain in right knee: Secondary | ICD-10-CM

## 2019-03-23 NOTE — Telephone Encounter (Signed)
Yes, please refer to Dr. Raeford Razor.  Thanks!

## 2019-03-23 NOTE — Telephone Encounter (Signed)
Okay to put in referral?  Copied from [place] [id number]. Topic: General - Other >> Mar 23, 2019  9:36 AM Leward Quan A wrote: Reason for CRM: Pateint called to inform Dr Zigmund Daniel that she is ok with him scheduling a visit with the sports medicine Dr. Ph# [phone]

## 2019-03-23 NOTE — Telephone Encounter (Signed)
Referral entered  

## 2019-03-30 ENCOUNTER — Ambulatory Visit (INDEPENDENT_AMBULATORY_CARE_PROVIDER_SITE_OTHER): Payer: Managed Care, Other (non HMO) | Admitting: Family Medicine

## 2019-03-30 ENCOUNTER — Ambulatory Visit: Payer: Self-pay

## 2019-03-30 ENCOUNTER — Other Ambulatory Visit: Payer: Self-pay

## 2019-03-30 VITALS — BP 127/86 | Ht 64.25 in | Wt 225.0 lb

## 2019-03-30 DIAGNOSIS — M25561 Pain in right knee: Secondary | ICD-10-CM | POA: Diagnosis not present

## 2019-03-30 MED ORDER — PENNSAID 2 % TD SOLN: 1.0000 "application " | Freq: Two times a day (BID) | TRANSDERMAL | 3 refills | Status: DC

## 2019-03-30 MED ORDER — METHYLPREDNISOLONE ACETATE 40 MG/ML IJ SUSP
40.0000 mg | Freq: Once | INTRAMUSCULAR | Status: AC
  Administered 2019-03-30: 40 mg via INTRA_ARTICULAR

## 2019-03-30 NOTE — Patient Instructions (Signed)
Nice to meet you  Please try ice  Please try tylenol  Please take vitamin D  Please try the exercises  Please try the rub on medicine  Please send me a message in MyChart with any questions or updates.  Please see me back in 4 weeks .   --Dr. Raeford Razor

## 2019-03-30 NOTE — Assessment & Plan Note (Addendum)
Effusion with normal-appearing synovial fluid.  Likely either an exacerbation of underlying arthritis or degenerative meniscal changes. -Aspiration and injection. -Counseled on home exercise therapy and supportive care. -If no improvement will consider physical therapy -Provided sample of 2 mg Rayos.

## 2019-03-30 NOTE — Progress Notes (Signed)
Caitlin Pitts - 63 y.o. female MRN [id number]  Date of birth: 1955/10/29  SUBJECTIVE:  Including CC & ROS.  No chief complaint on file.   Caitlin Pitts is a 63 y.o. female that is presenting with right knee pain.  The pain is been ongoing for 6 weeks.  She is tried rest with no improvement.  The pain is occurring over the medial joint line.  Her knee was twisted by her dog.  No prior history of injections or injury.  The pain is worse at night.  She has trouble going up and down stairs.  Is localized to the knee.  No mechanical symptoms..  Independent review of the right knee x-ray from 8/3 shows mild degenerative changes of the medial compartment.   Review of Systems  Constitutional: Negative for fever.  HENT: Negative for congestion.   Respiratory: Negative for cough.   Cardiovascular: Negative for chest pain.  Gastrointestinal: Negative for abdominal pain.    HISTORY: Past Medical, Surgical, Social, and Family History Reviewed & Updated per EMR.   Pertinent Historical Findings include:  Past Medical History:  Diagnosis Date  . Esophageal reflux 10/28/2015  . Major depression 10/28/2015  . Psoriasis 10/28/2015  . Pure hypercholesterolemia 10/28/2015    Past Surgical History:  Procedure Laterality Date  . CHOLECYSTECTOMY, LAPAROSCOPIC    . FOOT SURGERY    . HAND SURGERY      Allergies  Allergen Reactions  . Aspirin Swelling  . Desvenlafaxine Rash  . Penicillins Rash  . Phentermine Rash    Family History  Problem Relation Age of Onset  . Migraines Mother   . Hypercholesterolemia Mother   . Cancer Mother   . Cancer Maternal Grandmother        lungcancer  . Cancer Paternal Grandmother        colon cancer     Social History   Socioeconomic History  . Marital status: Divorced    Spouse name: Not on file  . Number of children: Not on file  . Years of education: Not on file  . Highest education level: Not on file  Occupational History  . Not on file  Social  Needs  . Financial resource strain: Not on file  . Food insecurity    Worry: Not on file    Inability: Not on file  . Transportation needs    Medical: Not on file    Non-medical: Not on file  Tobacco Use  . Smoking status: Former Research scientist (life sciences)  . Smokeless tobacco: Never Used  Substance and Sexual Activity  . Alcohol use: Not Currently  . Drug use: Never  . Sexual activity: Not Currently  Lifestyle  . Physical activity    Days per week: Not on file    Minutes per session: Not on file  . Stress: Not on file  Relationships  . Social Herbalist on phone: Not on file    Gets together: Not on file    Attends religious service: Not on file    Active member of club or organization: Not on file    Attends meetings of clubs or organizations: Not on file    Relationship status: Not on file  . Intimate partner violence    Fear of current or ex partner: Not on file    Emotionally abused: Not on file    Physically abused: Not on file    Forced sexual activity: Not on file  Other Topics Concern  . Not  on file  Social History Narrative  . Not on file     PHYSICAL EXAM:  VS: BP 127/86   Ht 5' 4.25" (1.632 m)   Wt 225 lb (102.1 kg)   BMI 38.32 kg/m  Physical Exam Gen: NAD, alert, cooperative with exam, well-appearing ENT: normal lips, normal nasal mucosa,  Eye: normal EOM, normal conjunctiva and lids CV:  no edema, +2 pedal pulses   Resp: no accessory muscle use, non-labored,   Skin: no rashes, no areas of induration  Neuro: normal tone, normal sensation to touch Psych:  normal insight, alert and oriented MSK:  Right knee: Tenderness to palpation of the medial joint line. No obvious effusion. Normal range of motion. Normal strength resistance. Positive Murray's test. No pain with patellar grind. Neurovascular intact  Limited ultrasound: Right knee:  Moderate effusion within the suprapatellar pouch. Some spurring at the insertion of the quadriceps into the  patella. Normal patellar tendon. No significant outpouching or medial joint space narrowing of the medial compartment. Normal-appearing lateral meniscus.  Summary: Moderate effusion within the suprapatellar pouch  Ultrasound and interpretation by Clare GandyJeremy Ezmeralda Stefanick, MD   Aspiration/Injection Procedure Note Caitlin Pitts 06/03/1956  Procedure: Aspiration and Injection Indications: Right knee pain  Procedure Details Consent: Risks of procedure as well as the alternatives and risks of each were explained to the (patient/caregiver).  Consent for procedure obtained. Time Out: Verified patient identification, verified procedure, site/side was marked, verified correct patient position, special equipment/implants available, medications/allergies/relevent history reviewed, required imaging and test results available.  Performed.  The area was cleaned with iodine and alcohol swabs.    The right knee superior lateral suprapatellar pouch was injected with 4 cc of 1% lidocaine without epinephrine on a 25-gauge 1-1/2 inch needle.  An 18-gauge 1-1/2 inch needle was inserted for aspiration.  The syringe was switched and a mixture containing 1 cc's of 40 mg Depo-Medrol and 4 cc's of 0.25% bupivacaine was injected.  Ultrasound was used. Images were obtained in long views showing the injection.    Amount of Fluid Aspirated: 20mL Character of Fluid: clear and straw colored Fluid was sent for:n/a  A sterile dressing was applied.  Patient did tolerate procedure well.      ASSESSMENT & PLAN:   Acute pain of right knee Effusion with normal-appearing synovial fluid.  Likely either an exacerbation of underlying arthritis or degenerative meniscal changes. -Aspiration and injection. -Counseled on home exercise therapy and supportive care. -If no improvement will consider physical therapy -Provided sample of 2 mg Rayos.

## 2019-03-30 NOTE — Progress Notes (Signed)
Medication Samples have been provided to the patient.  Drug name: Rayos     Strength: 2mg      Qty: 1  LOT: [id number]  Exp.Date: 5/21  Dosing instructions: take 2 tabs qhs  The patient has been instructed regarding the correct time, dose, and frequency of taking this medication, including desired effects and most common side effects.   Caitlin Pitts 10:08 AM 03/30/2019

## 2019-04-27 ENCOUNTER — Ambulatory Visit (INDEPENDENT_AMBULATORY_CARE_PROVIDER_SITE_OTHER): Payer: Managed Care, Other (non HMO) | Admitting: Family Medicine

## 2019-04-27 ENCOUNTER — Other Ambulatory Visit: Payer: Self-pay

## 2019-04-27 ENCOUNTER — Encounter: Payer: Self-pay | Admitting: Family Medicine

## 2019-04-27 VITALS — BP 132/90 | Ht 65.0 in | Wt 225.0 lb

## 2019-04-27 DIAGNOSIS — M23203 Derangement of unspecified medial meniscus due to old tear or injury, right knee: Secondary | ICD-10-CM

## 2019-04-27 NOTE — Progress Notes (Signed)
Caitlin Pitts - 63 y.o. female MRN [id number]  Date of birth: 09/11/1955  SUBJECTIVE:  Including CC & ROS.  Chief Complaint  Patient presents with  . Follow-up    follow up for right knee    Caitlin Pitts is a 63 y.o. female that is following up for her right knee pain.  She received an injection on 8/13 and has been doing better since that time.  She has been using a compression sleeve which helps with some of her symptoms.  She has not been able to do the home exercises as often as she would like.  She does have swelling intermittently.  She does have pain if she lies on the affected side.  Denies any mechanical symptoms.    Review of Systems  Constitutional: Negative for fever.  HENT: Negative for congestion.   Respiratory: Negative for cough.   Cardiovascular: Negative for chest pain.  Gastrointestinal: Negative for abdominal pain.  Musculoskeletal: Positive for joint swelling.  Skin: Negative for color change.  Neurological: Negative for weakness.  Hematological: Negative for adenopathy.    HISTORY: Past Medical, Surgical, Social, and Family History Reviewed & Updated per EMR.   Pertinent Historical Findings include:  Past Medical History:  Diagnosis Date  . Esophageal reflux 10/28/2015  . Major depression 10/28/2015  . Psoriasis 10/28/2015  . Pure hypercholesterolemia 10/28/2015    Past Surgical History:  Procedure Laterality Date  . CHOLECYSTECTOMY, LAPAROSCOPIC    . FOOT SURGERY    . HAND SURGERY      Allergies  Allergen Reactions  . Aspirin Swelling  . Desvenlafaxine Rash  . Penicillins Rash  . Phentermine Rash    Family History  Problem Relation Age of Onset  . Migraines Mother   . Hypercholesterolemia Mother   . Cancer Mother   . Cancer Maternal Grandmother        lungcancer  . Cancer Paternal Grandmother        colon cancer     Social History   Socioeconomic History  . Marital status: Divorced    Spouse name: Not on file  . Number of  children: Not on file  . Years of education: Not on file  . Highest education level: Not on file  Occupational History  . Not on file  Social Needs  . Financial resource strain: Not on file  . Food insecurity    Worry: Not on file    Inability: Not on file  . Transportation needs    Medical: Not on file    Non-medical: Not on file  Tobacco Use  . Smoking status: Former Games developermoker  . Smokeless tobacco: Never Used  Substance and Sexual Activity  . Alcohol use: Not Currently  . Drug use: Never  . Sexual activity: Not Currently  Lifestyle  . Physical activity    Days per week: Not on file    Minutes per session: Not on file  . Stress: Not on file  Relationships  . Social Musicianconnections    Talks on phone: Not on file    Gets together: Not on file    Attends religious service: Not on file    Active member of club or organization: Not on file    Attends meetings of clubs or organizations: Not on file    Relationship status: Not on file  . Intimate partner violence    Fear of current or ex partner: Not on file    Emotionally abused: Not on file  Physically abused: Not on file    Forced sexual activity: Not on file  Other Topics Concern  . Not on file  Social History Narrative  . Not on file     PHYSICAL EXAM:  VS: BP 132/90   Ht 5\' 5"  (1.651 m)   Wt 225 lb (102.1 kg)   BMI 37.44 kg/m  Physical Exam Gen: NAD, alert, cooperative with exam, well-appearing ENT: normal lips, normal nasal mucosa,  Eye: normal EOM, normal conjunctiva and lids CV:  no edema, +2 pedal pulses   Resp: no accessory muscle use, non-labored,  Skin: no rashes, no areas of induration  Neuro: normal tone, normal sensation to touch Psych:  normal insight, alert and oriented MSK:  Right knee: No obvious effusion. Normal range of motion. Some instability valgus varus stress testing. Normal strength resistance. Neurovascular intact     ASSESSMENT & PLAN:   Degenerative tear of medial meniscus  Improvement since the injection.  - counseled on HEp and supportive care - declined PT at this time  -  Could consider gel injections.

## 2019-04-27 NOTE — Assessment & Plan Note (Addendum)
Improvement since the injection.  - counseled on HEp and supportive care - declined PT at this time  -  Could consider gel injections.

## 2019-04-27 NOTE — Patient Instructions (Signed)
Good to see you Congratulations on the new house  Please try ice if needed Please try the exercises   Please send me a message in [place] with any questions or updates.  Please see [place] back as needed.   --Dr. Raeford Razor

## 2019-05-08 ENCOUNTER — Encounter: Payer: Self-pay | Admitting: Family Medicine

## 2019-05-12 ENCOUNTER — Ambulatory Visit (INDEPENDENT_AMBULATORY_CARE_PROVIDER_SITE_OTHER): Payer: Managed Care, Other (non HMO) | Admitting: Family Medicine

## 2019-05-12 ENCOUNTER — Other Ambulatory Visit: Payer: Self-pay

## 2019-05-12 ENCOUNTER — Encounter: Payer: Self-pay | Admitting: Family Medicine

## 2019-05-12 DIAGNOSIS — E6609 Other obesity due to excess calories: Secondary | ICD-10-CM | POA: Insufficient documentation

## 2019-05-12 DIAGNOSIS — Z6837 Body mass index (BMI) 37.0-37.9, adult: Secondary | ICD-10-CM | POA: Diagnosis not present

## 2019-05-12 DIAGNOSIS — E66811 Obesity, class 1: Secondary | ICD-10-CM | POA: Insufficient documentation

## 2019-05-12 MED ORDER — CONTRAVE 8-90 MG PO TB12: ORAL_TABLET | ORAL | 0 refills | Status: DC

## 2019-05-12 MED ORDER — BUPROPION HCL ER (XL) 150 MG PO TB24: 150.0000 mg | ORAL_TABLET | Freq: Every day | ORAL | 1 refills | Status: DC

## 2019-05-12 NOTE — Patient Instructions (Signed)
Calorie Counting for Weight Loss Calories are units of energy. Your body needs a certain amount of calories from food to keep you going throughout the day. When you eat more calories than your body needs, your body stores the extra calories as fat. When you eat fewer calories than your body needs, your body burns fat to get the energy it needs. Calorie counting means keeping track of how many calories you eat and drink each day. Calorie counting can be helpful if you need to lose weight. If you make sure to eat fewer calories than your body needs, you should lose weight. Ask your health care provider what a healthy weight is for you. For calorie counting to work, you will need to eat the right number of calories in a day in order to lose a healthy amount of weight per week. A dietitian can help you determine how many calories you need in a day and will give you suggestions on how to reach your calorie goal.  A healthy amount of weight to lose per week is usually 1-2 lb (0.5-0.9 kg). This usually means that your daily calorie intake should be reduced by 500-750 calories.  Eating 1,200 - 1,500 calories per day can help most women lose weight.  Eating 1,500 - 1,800 calories per day can help most men lose weight. What is my plan? My goal is to have __________ calories per day. If I have this many calories per day, I should lose around __________ pounds per week. What do I need to know about calorie counting? In order to meet your daily calorie goal, you will need to:  Find out how many calories are in each food you would like to eat. Try to do this before you eat.  Decide how much of the food you plan to eat.  Write down what you ate and how many calories it had. Doing this is called keeping a food log. To successfully lose weight, it is important to balance calorie counting with a healthy lifestyle that includes regular activity. Aim for 150 minutes of moderate exercise (such as walking) or 75  minutes of vigorous exercise (such as running) each week. Where do I find calorie information?  The number of calories in a food can be found on a Nutrition Facts label. If a food does not have a Nutrition Facts label, try to look up the calories online or ask your dietitian for help. Remember that calories are listed per serving. If you choose to have more than one serving of a food, you will have to multiply the calories per serving by the amount of servings you plan to eat. For example, the label on a package of bread might say that a serving size is 1 slice and that there are 90 calories in a serving. If you eat 1 slice, you will have eaten 90 calories. If you eat 2 slices, you will have eaten 180 calories. How do I keep a food log? Immediately after each meal, record the following information in your food log:  What you ate. Don't forget to include toppings, sauces, and other extras on the food.  How much you ate. This can be measured in cups, ounces, or number of items.  How many calories each food and drink had.  The total number of calories in the meal. Keep your food log near you, such as in a small notebook in your pocket, or use a mobile app or website. Some programs will calculate   calories for you and show you how many calories you have left for the day to meet your goal. What are some calorie counting tips?   Use your calories on foods and drinks that will fill you up and not leave you hungry: ? Some examples of foods that fill you up are nuts and nut butters, vegetables, lean proteins, and high-fiber foods like whole grains. High-fiber foods are foods with more than 5 g fiber per serving. ? Drinks such as sodas, specialty coffee drinks, alcohol, and juices have a lot of calories, yet do not fill you up.  Eat nutritious foods and avoid empty calories. Empty calories are calories you get from foods or beverages that do not have many vitamins or protein, such as candy, sweets, and  soda. It is better to have a nutritious high-calorie food (such as an avocado) than a food with few nutrients (such as a bag of chips).  Know how many calories are in the foods you eat most often. This will help you calculate calorie counts faster.  Pay attention to calories in drinks. Low-calorie drinks include water and unsweetened drinks.  Pay attention to nutrition labels for "low fat" or "fat free" foods. These foods sometimes have the same amount of calories or more calories than the full fat versions. They also often have added sugar, starch, or salt, to make up for flavor that was removed with the fat.  Find a way of tracking calories that works for you. Get creative. Try different apps or programs if writing down calories does not work for you. What are some portion control tips?  Know how many calories are in a serving. This will help you know how many servings of a certain food you can have.  Use a measuring cup to measure serving sizes. You could also try weighing out portions on a kitchen scale. With time, you will be able to estimate serving sizes for some foods.  Take some time to put servings of different foods on your favorite plates, bowls, and cups so you know what a serving looks like.  Try not to eat straight from a bag or box. Doing this can lead to overeating. Put the amount you would like to eat in a cup or on a plate to make sure you are eating the right portion.  Use smaller plates, glasses, and bowls to prevent overeating.  Try not to multitask (for example, watch TV or use your computer) while eating. If it is time to eat, sit down at a table and enjoy your food. This will help you to know when you are full. It will also help you to be aware of what you are eating and how much you are eating. What are tips for following this plan? Reading food labels  Check the calorie count compared to the serving size. The serving size may be smaller than what you are used to  eating.  Check the source of the calories. Make sure the food you are eating is high in vitamins and protein and low in saturated and trans fats. Shopping  Read nutrition labels while you shop. This will help you make healthy decisions before you decide to purchase your food.  Make a grocery list and stick to it. Cooking  Try to cook your favorite foods in a healthier way. For example, try baking instead of frying.  Use low-fat dairy products. Meal planning  Use more fruits and vegetables. Half of your plate should be fruits   and vegetables.  Include lean proteins like poultry and fish. How do I count calories when eating out?  Ask for smaller portion sizes.  Consider sharing an entree and sides instead of getting your own entree.  If you get your own entree, eat only half. Ask for a box at the beginning of your meal and put the rest of your entree in it so you are not tempted to eat it.  If calories are listed on the menu, choose the lower calorie options.  Choose dishes that include vegetables, fruits, whole grains, low-fat dairy products, and lean protein.  Choose items that are boiled, broiled, grilled, or steamed. Stay away from items that are buttered, battered, fried, or served with cream sauce. Items labeled "crispy" are usually fried, unless stated otherwise.  Choose water, low-fat milk, unsweetened iced tea, or other drinks without added sugar. If you want an alcoholic beverage, choose a lower calorie option such as a glass of wine or light beer.  Ask for dressings, sauces, and syrups on the side. These are usually high in calories, so you should limit the amount you eat.  If you want a salad, choose a garden salad and ask for grilled meats. Avoid extra toppings like bacon, cheese, or fried items. Ask for the dressing on the side, or ask for olive oil and vinegar or lemon to use as dressing.  Estimate how many servings of a food you are given. For example, a serving of  cooked rice is  cup or about the size of half a baseball. Knowing serving sizes will help you be aware of how much food you are eating at restaurants. The list below tells you how big or small some common portion sizes are based on everyday objects: ? 1 oz-4 stacked dice. ? 3 oz-1 deck of cards. ? 1 tsp-1 die. ? 1 Tbsp- a ping-pong ball. ? 2 Tbsp-1 ping-pong ball. ?  cup- baseball. ? 1 cup-1 baseball. Summary  Calorie counting means keeping track of how many calories you eat and drink each day. If you eat fewer calories than your body needs, you should lose weight.  A healthy amount of weight to lose per week is usually 1-2 lb (0.5-0.9 kg). This usually means reducing your daily calorie intake by 500-750 calories.  The number of calories in a food can be found on a Nutrition Facts label. If a food does not have a Nutrition Facts label, try to look up the calories online or ask your dietitian for help.  Use your calories on foods and drinks that will fill you up, and not on foods and drinks that will leave you hungry.  Use smaller plates, glasses, and bowls to prevent overeating. This information is not intended to replace advice given to you by your health care provider. Make sure you discuss any questions you have with your health care provider. Document Released: 08/03/2005 Document Revised: 04/22/2018 Document Reviewed: 07/03/2016 Elsevier Patient Education  2020 [employer]  Exercising to Lose Weight Exercise is structured, repetitive physical activity to improve fitness and health. Getting regular exercise is important for everyone. It is especially important if you are overweight. Being overweight increases your risk of heart disease, stroke, diabetes, high blood pressure, and several types of cancer. Reducing your calorie intake and exercising can help you lose weight. Exercise is usually categorized as moderate or vigorous intensity. To lose weight, most people need to do a  certain amount of moderate-intensity or vigorous-intensity exercise each week. Moderate-intensity   exercise  Moderate-intensity exercise is any activity that gets you moving enough to burn at least three times more energy (calories) than if you were sitting. Examples of moderate exercise include:  Walking a mile in 15 minutes.  Doing light yard work.  Biking at an easy pace. Most people should get at least 150 minutes (2 hours and 30 minutes) a week of moderate-intensity exercise to maintain their body weight. Vigorous-intensity exercise Vigorous-intensity exercise is any activity that gets you moving enough to burn at least six times more calories than if you were sitting. When you exercise at this intensity, you should be working hard enough that you are not able to carry on a conversation. Examples of vigorous exercise include:  Running.  Playing a team sport, such as football, basketball, and soccer.  Jumping rope. Most people should get at least 75 minutes (1 hour and 15 minutes) a week of vigorous-intensity exercise to maintain their body weight. How can exercise affect me? When you exercise enough to burn more calories than you eat, you lose weight. Exercise also reduces body fat and builds muscle. The more muscle you have, the more calories you burn. Exercise also:  Improves mood.  Reduces stress and tension.  Improves your overall fitness, flexibility, and endurance.  Increases bone strength. The amount of exercise you need to lose weight depends on:  Your age.  The type of exercise.  Any health conditions you have.  Your overall physical ability. Talk to your health care provider about how much exercise you need and what types of activities are safe for you. What actions can I take to lose weight? Nutrition   Make changes to your diet as told by your health care provider or diet and nutrition specialist (dietitian). This may include: ? Eating fewer calories. ?  Eating more protein. ? Eating less unhealthy fats. ? Eating a diet that includes fresh fruits and vegetables, whole grains, low-fat dairy products, and lean protein. ? Avoiding foods with added fat, salt, and sugar.  Drink plenty of water while you exercise to prevent dehydration or heat stroke. Activity  Choose an activity that you enjoy and set realistic goals. Your health care provider can help you make an exercise plan that works for you.  Exercise at a moderate or vigorous intensity most days of the week. ? The intensity of exercise may vary from person to person. You can tell how intense a workout is for you by paying attention to your breathing and heartbeat. Most people will notice their breathing and heartbeat get faster with more intense exercise.  Do resistance training twice each week, such as: ? Push-ups. ? Sit-ups. ? Lifting weights. ? Using resistance bands.  Getting short amounts of exercise can be just as helpful as long structured periods of exercise. If you have trouble finding time to exercise, try to include exercise in your daily routine. ? Get up, stretch, and walk around every 30 minutes throughout the day. ? Go for a walk during your lunch break. ? Park your car farther away from your destination. ? If you take public transportation, get off one stop early and walk the rest of the way. ? Make phone calls while standing up and walking around. ? Take the stairs instead of elevators or escalators.  Wear comfortable clothes and shoes with good support.  Do not exercise so much that you hurt yourself, feel dizzy, or get very short of breath. Where to find more information  [place] Department of   Health and Human Services: www.hhs.gov  Centers for Disease Control and Prevention (CDC): www.cdc.gov Contact a health care provider:  Before starting a new exercise program.  If you have questions or concerns about your weight.  If you have a medical problem that  keeps you from exercising. Get help right away if you have any of the following while exercising:  Injury.  Dizziness.  Difficulty breathing or shortness of breath that does not go away when you stop exercising.  Chest pain.  Rapid heartbeat. Summary  Being overweight increases your risk of heart disease, stroke, diabetes, high blood pressure, and several types of cancer.  Losing weight happens when you burn more calories than you eat.  Reducing the amount of calories you eat in addition to getting regular moderate or vigorous exercise each week helps you lose weight. This information is not intended to replace advice given to you by your health care provider. Make sure you discuss any questions you have with your health care provider. Document Released: 09/05/2010 Document Revised: 08/16/2017 Document Reviewed: 08/16/2017 Elsevier Patient Education  2020 [employer] Inc[employer]  

## 2019-05-12 NOTE — Assessment & Plan Note (Signed)
-  Discussed medication options for weight loss.  Given intolerance to phentermine and wanting to avoid Orlistat due to side effects and saxenda due to injections we decided on a trial of Contrave.  She denies opioid or significant EtOH use.  -Discussed that this should be used in combination with a reduced calorie diet and regular exercise.  -She will follow up in 1 month to reassess.

## 2019-05-12 NOTE — Progress Notes (Signed)
Caitlin Pitts - 63 y.o. female MRN [id number]  Date of birth: September 17, 1955  Subjective Chief Complaint  Patient presents with  . Medication Management    pt. voiced that she would like to discuss starting medication for weight loss    HPI Caitlin Pitts is a 63 y.o. female with history of morbid obesity with HLD and GERD here today to discuss weight management.  She has struggled with weight loss for several years.  She has tried dieting and increased exercise with only short term success.  She tried phentermine in the past as well but did not tolerate well due to side effect of palpitations.  She would like to discuss additional weight loss options.  She does not want to take orlistat due to potential GI side effects and does not want to use and injectable medication such as Saxenda.  She is currently on bupropion for depression and tolerating this well.  She denies any opioid use and consumes EtOH rarely.   ROS:  A comprehensive ROS was completed and negative except as noted per HPI     Allergies  Allergen Reactions  . Aspirin Swelling  . Desvenlafaxine Rash  . Penicillins Rash  . Phentermine Rash    Past Medical History:  Diagnosis Date  . Esophageal reflux 10/28/2015  . Major depression 10/28/2015  . Psoriasis 10/28/2015  . Pure hypercholesterolemia 10/28/2015    Past Surgical History:  Procedure Laterality Date  . CHOLECYSTECTOMY, LAPAROSCOPIC    . FOOT SURGERY    . HAND SURGERY      Social History   Socioeconomic History  . Marital status: Divorced    Spouse name: Not on file  . Number of children: Not on file  . Years of education: Not on file  . Highest education level: Not on file  Occupational History  . Not on file  Social Needs  . Financial resource strain: Not on file  . Food insecurity    Worry: Not on file    Inability: Not on file  . Transportation needs    Medical: Not on file    Non-medical: Not on file  Tobacco Use  . Smoking status: Former  Games developer  . Smokeless tobacco: Never Used  Substance and Sexual Activity  . Alcohol use: Not Currently  . Drug use: Never  . Sexual activity: Not Currently  Lifestyle  . Physical activity    Days per week: Not on file    Minutes per session: Not on file  . Stress: Not on file  Relationships  . Social Musician on phone: Not on file    Gets together: Not on file    Attends religious service: Not on file    Active member of club or organization: Not on file    Attends meetings of clubs or organizations: Not on file    Relationship status: Not on file  Other Topics Concern  . Not on file  Social History Narrative  . Not on file    Family History  Problem Relation Age of Onset  . Migraines Mother   . Hypercholesterolemia Mother   . Cancer Mother   . Cancer Maternal Grandmother        lungcancer  . Cancer Paternal Grandmother        colon cancer    Health Maintenance  Topic Date Due  . Hepatitis C Screening  [birth year]  . HIV Screening  03/21/1971  . PAP SMEAR-Modifier  03/20/1977  .  MAMMOGRAM  03/20/2006  . INFLUENZA VACCINE  03/18/2019  . COLONOSCOPY  07/31/2025  . TETANUS/TDAP  03/19/2029    ----------------------------------------------------------------------------------------------------------------------------------------------------------------------------------------------------------------- Physical Exam BP 120/74   Pulse 72   Temp 98 F (36.7 C) (Oral)   Ht 5\' 5"  (1.651 m)   Wt 224 lb 3.2 oz (101.7 kg)   SpO2 97%   BMI 37.31 kg/m   Physical Exam Constitutional:      Appearance: Normal appearance.  HENT:     Head: Normocephalic and atraumatic.     Right Ear: Tympanic membrane normal.     Left Ear: Tympanic membrane normal.     Mouth/Throat:     Mouth: Mucous membranes are moist.  Eyes:     General: No scleral icterus. Neck:     Musculoskeletal: Neck supple.  Cardiovascular:     Rate and Rhythm: Normal rate and regular rhythm.   Pulmonary:     Effort: Pulmonary effort is normal.     Breath sounds: Normal breath sounds.  Skin:    General: Skin is warm and dry.     Findings: No rash.  Neurological:     General: No focal deficit present.     Mental Status: She is alert.  Psychiatric:        Mood and Affect: Mood normal.        Behavior: Behavior normal.     ------------------------------------------------------------------------------------------------------------------------------------------------------------------------------------------------------------------- Assessment and Plan  Class 2 severe obesity due to excess calories with serious comorbidity and body mass index (BMI) of 37.0 to 37.9 in adult [facility]) -Discussed medication options for weight loss.  Given intolerance to phentermine and wanting to avoid Orlistat due to side effects and saxenda due to injections we decided on a trial of Contrave.  She denies opioid or significant EtOH use.  -Discussed that this should be used in combination with a reduced calorie diet and regular exercise.  -She will follow up in 1 month to reassess.    >25 minutes spent with patient with 50% of time spent providing counseling regarding weight loss as documented above.

## 2019-05-15 ENCOUNTER — Telehealth: Payer: Self-pay

## 2019-05-15 NOTE — Telephone Encounter (Signed)
Received PA request for Contrave. PA submitted via covermymeds. Key: [id number].

## 2019-05-15 NOTE — Telephone Encounter (Signed)
PA approved. Form faxed back to pharmacy. 

## 2019-05-30 ENCOUNTER — Encounter: Payer: Self-pay | Admitting: Family Medicine

## 2019-06-15 ENCOUNTER — Telehealth: Payer: Self-pay

## 2019-06-15 NOTE — Telephone Encounter (Signed)

## 2019-06-16 ENCOUNTER — Encounter: Payer: Self-pay | Admitting: Family Medicine

## 2019-06-16 ENCOUNTER — Other Ambulatory Visit: Payer: Self-pay

## 2019-06-16 ENCOUNTER — Ambulatory Visit (INDEPENDENT_AMBULATORY_CARE_PROVIDER_SITE_OTHER): Payer: Managed Care, Other (non HMO) | Admitting: Family Medicine

## 2019-06-16 DIAGNOSIS — Z6837 Body mass index (BMI) 37.0-37.9, adult: Secondary | ICD-10-CM

## 2019-06-16 DIAGNOSIS — Z23 Encounter for immunization: Secondary | ICD-10-CM

## 2019-06-16 NOTE — Patient Instructions (Signed)
Healthy Eating Following a healthy eating pattern may help you to achieve and maintain a healthy body weight, reduce the risk of chronic disease, and live a long and productive life. It is important to follow a healthy eating pattern at an appropriate calorie level for your body. Your nutritional needs should be met primarily through food by choosing a variety of nutrient-rich foods. What are tips for following this plan? Reading food labels  Read labels and choose the following: ? Reduced or low sodium. ? Juices with 100% fruit juice. ? Foods with low saturated fats and high polyunsaturated and monounsaturated fats. ? Foods with whole grains, such as whole wheat, cracked wheat, brown rice, and wild rice. ? Whole grains that are fortified with folic acid. This is recommended for women who are pregnant or who want to become pregnant.  Read labels and avoid the following: ? Foods with a lot of added sugars. These include foods that contain brown sugar, corn sweetener, corn syrup, dextrose, fructose, glucose, high-fructose corn syrup, honey, invert sugar, lactose, malt syrup, maltose, molasses, raw sugar, sucrose, trehalose, or turbinado sugar.  Do not eat more than the following amounts of added sugar per day:  6 teaspoons (25 g) for women.  9 teaspoons (38 g) for men. ? Foods that contain processed or refined starches and grains. ? Refined grain products, such as white flour, degermed cornmeal, white bread, and white rice. Shopping  Choose nutrient-rich snacks, such as vegetables, whole fruits, and nuts. Avoid high-calorie and high-sugar snacks, such as potato chips, fruit snacks, and candy.  Use oil-based dressings and spreads on foods instead of solid fats such as butter, stick margarine, or cream cheese.  Limit pre-made sauces, mixes, and "instant" products such as flavored rice, instant noodles, and ready-made pasta.  Try more plant-protein sources, such as tofu, tempeh, black beans,  edamame, lentils, nuts, and seeds.  Explore eating plans such as the Mediterranean diet or vegetarian diet. Cooking  Use oil to saut or stir-fry foods instead of solid fats such as butter, stick margarine, or lard.  Try baking, boiling, grilling, or broiling instead of frying.  Remove the fatty part of meats before cooking.  Steam vegetables in water or broth. Meal planning   At meals, imagine dividing your plate into fourths: ? One-half of your plate is fruits and vegetables. ? One-fourth of your plate is whole grains. ? One-fourth of your plate is protein, especially lean meats, poultry, eggs, tofu, beans, or nuts.  Include low-fat dairy as part of your daily diet. Lifestyle  Choose healthy options in all settings, including home, work, school, restaurants, or stores.  Prepare your food safely: ? Wash your hands after handling raw meats. ? Keep food preparation surfaces clean by regularly washing with hot, soapy water. ? Keep raw meats separate from ready-to-eat foods, such as fruits and vegetables. ? Cook seafood, meat, poultry, and eggs to the recommended internal temperature. ? Store foods at safe temperatures. In general:  Keep cold foods at 59F (4.4C) or below.  Keep hot foods at 159F (60C) or above.  Keep your freezer at [facility] (-17.8C) or below.  Foods are no longer safe to eat when they have been between the temperatures of 40-159F (4.4-60C) for more than 2 hours. What foods should I eat? Fruits Aim to eat 2 cup-equivalents of fresh, canned (in natural juice), or frozen fruits each day. Examples of 1 cup-equivalent of fruit include 1 small apple, 8 large strawberries, 1 cup canned fruit,  cup  dried fruit, or 1 cup 100% juice. Vegetables Aim to eat 2-3 cup-equivalents of fresh and frozen vegetables each day, including different varieties and colors. Examples of 1 cup-equivalent of vegetables include 2 medium carrots, 2 cups raw, leafy greens, 1 cup chopped  vegetable (raw or cooked), or 1 medium baked potato. Grains Aim to eat 6 ounce-equivalents of whole grains each day. Examples of 1 ounce-equivalent of grains include 1 slice of bread, 1 cup ready-to-eat cereal, 3 cups popcorn, or  cup cooked rice, pasta, or cereal. Meats and other proteins Aim to eat 5-6 ounce-equivalents of protein each day. Examples of 1 ounce-equivalent of protein include 1 egg, 1/2 cup nuts or seeds, or 1 tablespoon (16 g) peanut butter. A cut of meat or fish that is the size of a deck of cards is about 3-4 ounce-equivalents.  Of the protein you eat each week, try to have at least 8 ounces come from seafood. This includes salmon, trout, herring, and anchovies. Dairy Aim to eat 3 cup-equivalents of fat-free or low-fat dairy each day. Examples of 1 cup-equivalent of dairy include 1 cup (240 mL) milk, 8 ounces (250 g) yogurt, 1 ounces (44 g) natural cheese, or 1 cup (240 mL) fortified soy milk. Fats and oils  Aim for about 5 teaspoons (21 g) per day. Choose monounsaturated fats, such as canola and olive oils, avocados, peanut butter, and most nuts, or polyunsaturated fats, such as sunflower, corn, and soybean oils, walnuts, pine nuts, sesame seeds, sunflower seeds, and flaxseed. Beverages  Aim for six 8-oz glasses of water per day. Limit coffee to three to five 8-oz cups per day.  Limit caffeinated beverages that have added calories, such as soda and energy drinks.  Limit alcohol intake to no more than 1 drink a day for nonpregnant women and 2 drinks a day for men. One drink equals 12 oz of beer (355 mL), 5 oz of wine (148 mL), or 1 oz of hard liquor (44 mL). Seasoning and other foods  Avoid adding excess amounts of salt to your foods. Try flavoring foods with herbs and spices instead of salt.  Avoid adding sugar to foods.  Try using oil-based dressings, sauces, and spreads instead of solid fats. This information is based on general [place] nutrition guidelines. For more  information, visit choosemyplate.gov. Exact amounts may vary based on your nutrition needs. Summary  A healthy eating plan may help you to maintain a healthy weight, reduce the risk of chronic diseases, and stay active throughout your life.  Plan your meals. Make sure you eat the right portions of a variety of nutrient-rich foods.  Try baking, boiling, grilling, or broiling instead of frying.  Choose healthy options in all settings, including home, work, school, restaurants, or stores. This information is not intended to replace advice given to you by your health care provider. Make sure you discuss any questions you have with your health care provider. Document Released: 11/15/2017 Document Revised: 11/15/2017 Document Reviewed: 11/15/2017 Elsevier Patient Education  2020 Elsevier Inc.  

## 2019-06-16 NOTE — Progress Notes (Signed)
Caitlin Pitts - 63 y.o. female MRN [id number]  Date of birth: 07-13-1956  Subjective Chief Complaint  Patient presents with  . Follow-up    discuss medications for weight loss.    HPI Caitlin Pitts is 63 y.o. female here today for follow up of weight management.  She was started on contrave about 4 weeks ago.  Unfortunately she has not had any success with this medication so far.  Her weight is up 4 lbs from previous visit.  She does admit to eating more processed convenience foods and take out due to her kitchen being remodeled.  She says this is near completion and she would like to continue medication to see if she is able to lose weight once she is following a better diet.  Overall she is tolerating well at this time.   ROS:  A comprehensive ROS was completed and negative except as noted per HPI   Allergies  Allergen Reactions  . Aspirin Swelling  . Desvenlafaxine Rash  . Penicillins Rash  . Phentermine Rash    Past Medical History:  Diagnosis Date  . Esophageal reflux 10/28/2015  . Major depression 10/28/2015  . Psoriasis 10/28/2015  . Pure hypercholesterolemia 10/28/2015    Past Surgical History:  Procedure Laterality Date  . CHOLECYSTECTOMY, LAPAROSCOPIC    . FOOT SURGERY    . HAND SURGERY      Social History   Socioeconomic History  . Marital status: Divorced    Spouse name: Not on file  . Number of children: Not on file  . Years of education: Not on file  . Highest education level: Not on file  Occupational History  . Not on file  Social Needs  . Financial resource strain: Not on file  . Food insecurity    Worry: Not on file    Inability: Not on file  . Transportation needs    Medical: Not on file    Non-medical: Not on file  Tobacco Use  . Smoking status: Former Research scientist (life sciences)  . Smokeless tobacco: Never Used  Substance and Sexual Activity  . Alcohol use: Not Currently  . Drug use: Never  . Sexual activity: Not Currently  Lifestyle  . Physical activity     Days per week: Not on file    Minutes per session: Not on file  . Stress: Not on file  Relationships  . Social Herbalist on phone: Not on file    Gets together: Not on file    Attends religious service: Not on file    Active member of club or organization: Not on file    Attends meetings of clubs or organizations: Not on file    Relationship status: Not on file  Other Topics Concern  . Not on file  Social History Narrative  . Not on file    Family History  Problem Relation Age of Onset  . Migraines Mother   . Hypercholesterolemia Mother   . Cancer Mother   . Cancer Maternal Grandmother        lungcancer  . Cancer Paternal Grandmother        colon cancer    Health Maintenance  Topic Date Due  . Hepatitis C Screening  01/20/1956  . HIV Screening  03/21/1971  . PAP SMEAR-Modifier  03/20/1977  . MAMMOGRAM  03/20/2006  . COLONOSCOPY  07/31/2025  . TETANUS/TDAP  03/19/2029  . INFLUENZA VACCINE  Completed    ----------------------------------------------------------------------------------------------------------------------------------------------------------------------------------------------------------------- Physical Exam BP 124/68  Pulse 76   Temp 97.8 F (36.6 C) (Tympanic)   Ht 5\' 5"  (1.651 m)   Wt 229 lb 6.4 oz (104.1 kg)   SpO2 97%   BMI 38.17 kg/m   Physical Exam Constitutional:      Appearance: Normal appearance.  HENT:     Head: Normocephalic and atraumatic.  Cardiovascular:     Rate and Rhythm: Normal rate and regular rhythm.  Pulmonary:     Effort: Pulmonary effort is normal.     Breath sounds: Normal breath sounds.  Skin:    General: Skin is warm and dry.  Neurological:     General: No focal deficit present.  Psychiatric:        Mood and Affect: Mood normal.        Behavior: Behavior normal.      ------------------------------------------------------------------------------------------------------------------------------------------------------------------------------------------------------------------- Assessment and Plan  Class 2 severe obesity due to excess calories with serious comorbidity and body mass index (BMI) of 37.0 to 37.9 in adult ([facility]CC) -Weight is up some since last visit.  -Discussed that she should really focus on dietary changes to be successful with medication. -Will continue contrave for now with follow up in 2 months. If weight not improving significantly at that time discussed we'll plan to d/c medication.  Return in about 8 weeks (around 08/11/2019) for Weight mgmt.

## 2019-06-16 NOTE — Assessment & Plan Note (Signed)
-  Weight is up some since last visit.  -Discussed that she should really focus on dietary changes to be successful with medication. -Will continue contrave for now with follow up in 2 months. If weight not improving significantly at that time discussed we'll plan to d/c medication.  Return in about 8 weeks (around 08/11/2019) for Weight mgmt.

## 2019-06-22 ENCOUNTER — Encounter: Payer: Self-pay | Admitting: Family Medicine

## 2019-06-25 ENCOUNTER — Encounter: Payer: Self-pay | Admitting: Family Medicine

## 2019-07-04 ENCOUNTER — Ambulatory Visit: Payer: Self-pay

## 2019-07-11 ENCOUNTER — Ambulatory Visit: Payer: Managed Care, Other (non HMO)

## 2019-07-21 ENCOUNTER — Encounter: Payer: Self-pay | Admitting: Family Medicine

## 2019-07-21 ENCOUNTER — Telehealth (INDEPENDENT_AMBULATORY_CARE_PROVIDER_SITE_OTHER): Payer: Managed Care, Other (non HMO) | Admitting: Family Medicine

## 2019-07-21 DIAGNOSIS — R21 Rash and other nonspecific skin eruption: Secondary | ICD-10-CM | POA: Insufficient documentation

## 2019-07-21 NOTE — Assessment & Plan Note (Signed)
Difficult to visualize rash over video today however based on description this doesn't sound like shingles.  It sounds to be more like contact/irritant dermatitis.   Recommend trying clobetasol to area.  She will let me know early next week if this is helping or if symptoms persist or worsen.

## 2019-07-21 NOTE — Progress Notes (Signed)
Caitlin Pitts - 63 y.o. female MRN [id number]  Date of birth: 1956/01/13   This visit type was conducted due to national recommendations for restrictions regarding the COVID-19 Pandemic (e.g. social distancing).  This format is felt to be most appropriate for this patient at this time.  All issues noted in this document were discussed and addressed.  No physical exam was performed (except for noted visual exam findings with Video Visits).  I discussed the limitations of evaluation and management by telemedicine and the availability of in person appointments. The patient expressed understanding and agreed to proceed.  I connected with@ on 07/21/19 at  3:00 PM EST by a video enabled telemedicine application and verified that I am speaking with the correct person using two identifiers.  Present at visit: Caitlin Nutting, DO Caitlin Pitts   Patient Location: Home [address] [place] [address]   Provider location:   [place]  Chief Complaint  Patient presents with  . Skin Problem    Pt agrees with virtual visit.  Patient is C/O burning and itching in right axilla where bra hits. She states that the rash is 2-3" long but also a spot on back on the same sit. It itches from axilla to nipple. It started maybe 1-week-ago but has been unsure if it was due to psoriasis. She is wondering if it is shingles. Looks like welps but no blisters. There is a discomfort like a muscle soreness.    HPI  AMBYR QADRI is a 63 y.o. female who presents via audio/video conferencing for a telehealth visit today.  She has complaint of rash in R underarm area.  Rash first appeared about 1 week ago.  It appears to be hive like.  She denies any blisters.  Rash is itchy in nature but she does have some burning if scratching.  She also feels some mild muscle aches.  There has been no blistering or worsening since onset.  She denies fever or chills.  She has not tried anything for treatment so far.   She denies changes to soaps/lotions/detergents.  She doe have some clobetasol that she has used for psoriasis.     ROS:  A comprehensive ROS was completed and negative except as noted per HPI  Past Medical History:  Diagnosis Date  . Esophageal reflux 10/28/2015  . Major depression 10/28/2015  . Psoriasis 10/28/2015  . Pure hypercholesterolemia 10/28/2015    Past Surgical History:  Procedure Laterality Date  . CHOLECYSTECTOMY, LAPAROSCOPIC    . FOOT SURGERY    . HAND SURGERY      Family History  Problem Relation Age of Onset  . Migraines Mother   . Hypercholesterolemia Mother   . Cancer Mother   . Cancer Maternal Grandmother        lungcancer  . Cancer Paternal Grandmother        colon cancer    Social History   Socioeconomic History  . Marital status: Divorced    Spouse name: Not on file  . Number of children: Not on file  . Years of education: Not on file  . Highest education level: Not on file  Occupational History  . Not on file  Social Needs  . Financial resource strain: Not on file  . Food insecurity    Worry: Not on file    Inability: Not on file  . Transportation needs    Medical: Not on file    Non-medical: Not on file  Tobacco  Use  . Smoking status: Former Smoker  . Smokeless tobacco: Never Used  Substance and Sexual Activity  . Alcohol use: Not Currently  . Drug use: Never  . Sexual activity: Not Currently  Lifestyle  . Physical activity    Days per week: Not on file    Minutes per session: Not on file  . Stress: Not on file  Relationships  . Social Musician on phone: Not on file    Gets together: Not on file    Attends religious service: Not on file    Active member of club or organization: Not on file    Attends meetings of clubs or organizations: Not on file    Relationship status: Not on file  . Intimate partner violence    Fear of current or ex partner: Not on file    Emotionally abused: Not on file    Physically abused:  Not on file    Forced sexual activity: Not on file  Other Topics Concern  . Not on file  Social History Narrative  . Not on file     Current Outpatient Medications:  .  buPROPion (WELLBUTRIN XL) 150 MG 24 hr tablet, Take 1 tablet (150 mg total) by mouth daily., Disp: 90 tablet, Rfl: 1 .  calcipotriene (DOVONOX) 0.005 % cream, APPLY CREAM TOPICALLY TWICE DAILY, Disp: , Rfl:  .  diazepam (VALIUM) 2 MG tablet, Take 1 tablet prior to flying., Disp: , Rfl:  .  Diclofenac Sodium (PENNSAID) 2 % SOLN, Place 1 application onto the skin 2 (two) times daily., Disp: 112 g, Rfl: 3 .  escitalopram (LEXAPRO) 10 MG tablet, Take 10 mg by mouth., Disp: , Rfl:  .  folic acid (FOLVITE) 1 MG tablet, Take 1 mg by mouth daily., Disp: , Rfl:  .  methotrexate (RHEUMATREX) 2.5 MG tablet, , Disp: , Rfl:   EXAM:  VITALS per patient if applicable: There were no vitals taken for this visit.  GENERAL: alert, oriented, appears well and in no acute distress  HEENT: atraumatic, conjunttiva clear, no obvious abnormalities on inspection of external nose and ears  NECK: normal movements of the head and neck  LUNGS: on inspection no signs of respiratory distress, breathing rate appears normal, no obvious gross SOB, gasping or wheezing  CV: no obvious cyanosis  MS: moves all visible extremities without noticeable abnormality  PSYCH/NEURO: pleasant and cooperative, no obvious depression or anxiety, speech and thought processing grossly intact  ASSESSMENT AND PLAN:  Discussed the following assessment and plan:  Rash Difficult to visualize rash over video today however based on description this doesn't sound like shingles.  It sounds to be more like contact/irritant dermatitis.   Recommend trying clobetasol to area.  She will let me know early next week if this is helping or if symptoms persist or worsen.      I discussed the assessment and treatment plan with the patient. The patient was provided an  opportunity to ask questions and all were answered. The patient agreed with the plan and demonstrated an understanding of the instructions.   The patient was advised to call back or seek an in-person evaluation if the symptoms worsen or if the condition fails to improve as anticipated.    Everrett Coombe, DO

## 2019-07-25 ENCOUNTER — Encounter: Payer: Self-pay | Admitting: Family Medicine

## 2019-07-26 ENCOUNTER — Other Ambulatory Visit: Payer: Self-pay | Admitting: Family Medicine

## 2019-07-26 MED ORDER — VALACYCLOVIR HCL 1 G PO TABS: 1000.0000 mg | ORAL_TABLET | Freq: Three times a day (TID) | ORAL | 0 refills | Status: AC

## 2019-08-08 ENCOUNTER — Ambulatory Visit: Payer: Managed Care, Other (non HMO) | Admitting: Family Medicine

## 2019-11-02 ENCOUNTER — Telehealth (INDEPENDENT_AMBULATORY_CARE_PROVIDER_SITE_OTHER): Payer: Managed Care, Other (non HMO) | Admitting: Family Medicine

## 2019-11-02 ENCOUNTER — Encounter: Payer: Self-pay | Admitting: Family Medicine

## 2019-11-02 ENCOUNTER — Other Ambulatory Visit: Payer: Self-pay

## 2019-11-02 DIAGNOSIS — F3341 Major depressive disorder, recurrent, in partial remission: Secondary | ICD-10-CM

## 2019-11-02 MED ORDER — ESCITALOPRAM OXALATE 10 MG PO TABS: 10.0000 mg | ORAL_TABLET | Freq: Every day | ORAL | 3 refills | Status: DC

## 2019-11-02 MED ORDER — BUPROPION HCL ER (XL) 300 MG PO TB24: 300.0000 mg | ORAL_TABLET | Freq: Every day | ORAL | 3 refills | Status: DC

## 2019-11-02 MED ORDER — ESCITALOPRAM OXALATE 10 MG PO TABS: 10.0000 mg | ORAL_TABLET | Freq: Every day | ORAL | 0 refills | Status: DC

## 2019-11-02 NOTE — Progress Notes (Signed)
Virtual Visit via Video Note  I connected with Caitlin Pitts on 11/02/19 at  8:30 AM EDT by a video enabled telemedicine application and verified that I am speaking with the correct person using two identifiers. Location patient: home Location provider: work or home office Persons participating in the virtual visit: patient, provider  I discussed the limitations of evaluation and management by telemedicine and the availability of in person appointments. The patient expressed understanding and agreed to proceed.  Chief Complaint  Patient presents with  . Establish Care    TOC-from Dr. Mathews-medication refills-escitalopram, pt stated bupropion shes taking 150 but that was with contrave an would like to go to 300mg  an wants 30 days sent to [facility] in [place] not mail//pt having 1st pzifer covid vaccine Friday     HPI: Caitlin Pitts is a 64 y.o. female who is seen today for f/u on depressoin and anxiety. She needs refills of her lexapro and wellbutrin. She would like to increase wellbutrin from 150mg  back to 300mg . She was on contrave for a time so previous PCP Dr. 64 had decreased dose at that time. She has been off contrave and would like to resume 300mg . She has been on this regimen for years, feels it is the most effective of the meds she has tried. Only side effect is weight gain.  She is scheduled for her 1st covid vaccine tomorrow.  Past Medical History:  Diagnosis Date  . Esophageal reflux 10/28/2015  . Major depression 10/28/2015  . Psoriasis 10/28/2015  . Pure hypercholesterolemia 10/28/2015    Past Surgical History:  Procedure Laterality Date  . CHOLECYSTECTOMY, LAPAROSCOPIC    . FOOT SURGERY    . HAND SURGERY      Family History  Problem Relation Age of Onset  . Migraines Mother   . Hypercholesterolemia Mother   . Cancer Mother   . Cancer Maternal Grandmother        lungcancer  . Cancer Paternal Grandmother        colon cancer    Social History    Tobacco Use  . Smoking status: Former 10/30/2015  . Smokeless tobacco: Never Used  Substance Use Topics  . Alcohol use: Not Currently  . Drug use: Never     Current Outpatient Medications:  .  buPROPion (WELLBUTRIN XL) 150 MG 24 hr tablet, Take 1 tablet (150 mg total) by mouth daily., Disp: 90 tablet, Rfl: 1 .  calcipotriene (DOVONOX) 0.005 % cream, APPLY CREAM TOPICALLY TWICE DAILY, Disp: , Rfl:  .  diazepam (VALIUM) 2 MG tablet, Take 1 tablet prior to flying., Disp: , Rfl:  .  Diclofenac Sodium (PENNSAID) 2 % SOLN, Place 1 application onto the skin 2 (two) times daily., Disp: 112 g, Rfl: 3 .  escitalopram (LEXAPRO) 10 MG tablet, Take 10 mg by mouth., Disp: , Rfl:  .  folic acid (FOLVITE) 1 MG tablet, Take 1 mg by mouth daily., Disp: , Rfl:  .  methotrexate (RHEUMATREX) 2.5 MG tablet, , Disp: , Rfl:   Allergies  Allergen Reactions  . Aspirin Swelling  . Desvenlafaxine Rash  . Penicillins Rash  . Phentermine Rash      ROS: See pertinent positives and negatives per HPI.   EXAM:  VITALS per patient if applicable: There were no vitals taken for this visit.   GENERAL: alert, oriented, appears well and in no acute distress  NECK: normal movements of the head and neck  LUNGS: on inspection no signs of  respiratory distress, breathing rate appears normal, no obvious gross SOB, gasping or wheezing, no conversational dyspnea  CV: no obvious cyanosis  PSYCH/NEURO: pleasant and cooperative, no obvious depression or anxiety, speech and thought processing grossly intact   ASSESSMENT AND PLAN: 1. Recurrent major depressive disorder, in partial remission (HCC) - stable, well-controlled, on current regimen x years Refill: - buPROPion (WELLBUTRIN XL) 300 MG 24 hr tablet; Take 1 tablet (300 mg total) by mouth daily.  Dispense: 90 tablet; Refill: 3 - increased back up to 300mg  from 150mg  which had been her dose while taking contrave Refill: - escitalopram (LEXAPRO) 10 MG tablet; Take  1 tablet (10 mg total) by mouth daily.  Dispense: 90 tablet; Refill: 3  Pt due in 03/2020 for CPE, labs   I discussed the assessment and treatment plan with the patient. The patient was provided an opportunity to ask questions and all were answered. The patient agreed with the plan and demonstrated an understanding of the instructions.   The patient was advised to call back or seek an in-person evaluation if the symptoms worsen or if the condition fails to improve as anticipated.   Letta Median, DO

## 2019-12-28 IMAGING — DX RIGHT KNEE - COMPLETE 4+ VIEW
4 series · 4 of 4 positions shown · non-contrast
Comparison: No recent prior.

CLINICAL DATA: Right knee pain.

EXAM:
RIGHT KNEE - COMPLETE 4+ VIEW

[knee ap]
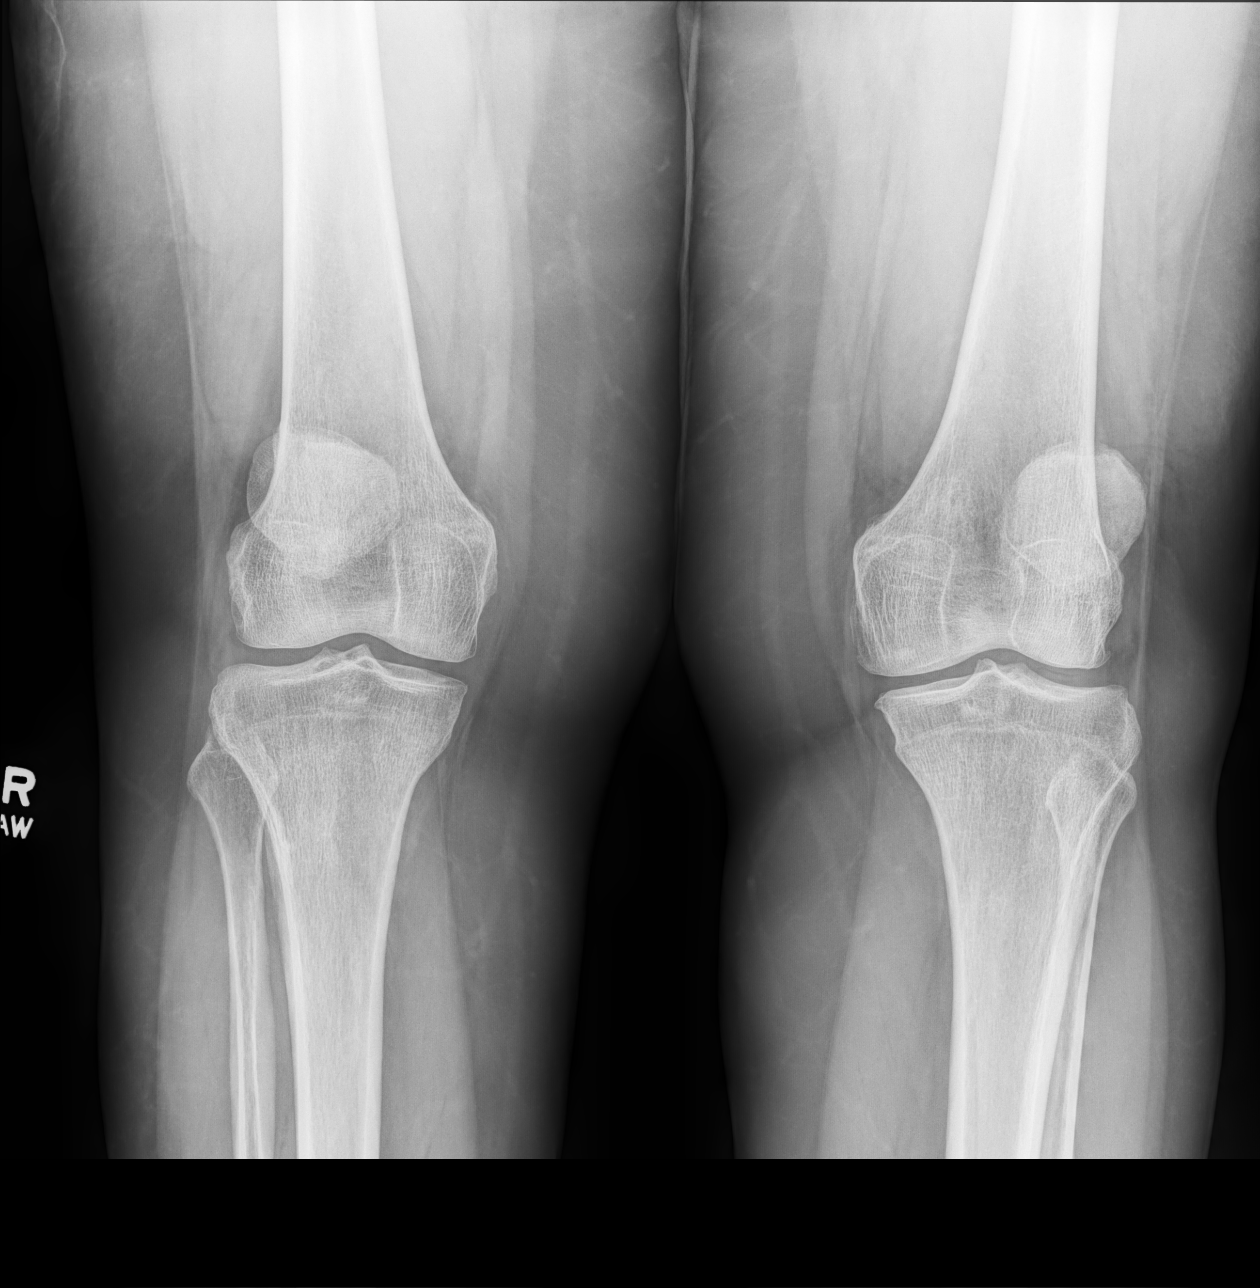

[knee [person_name] view pa]
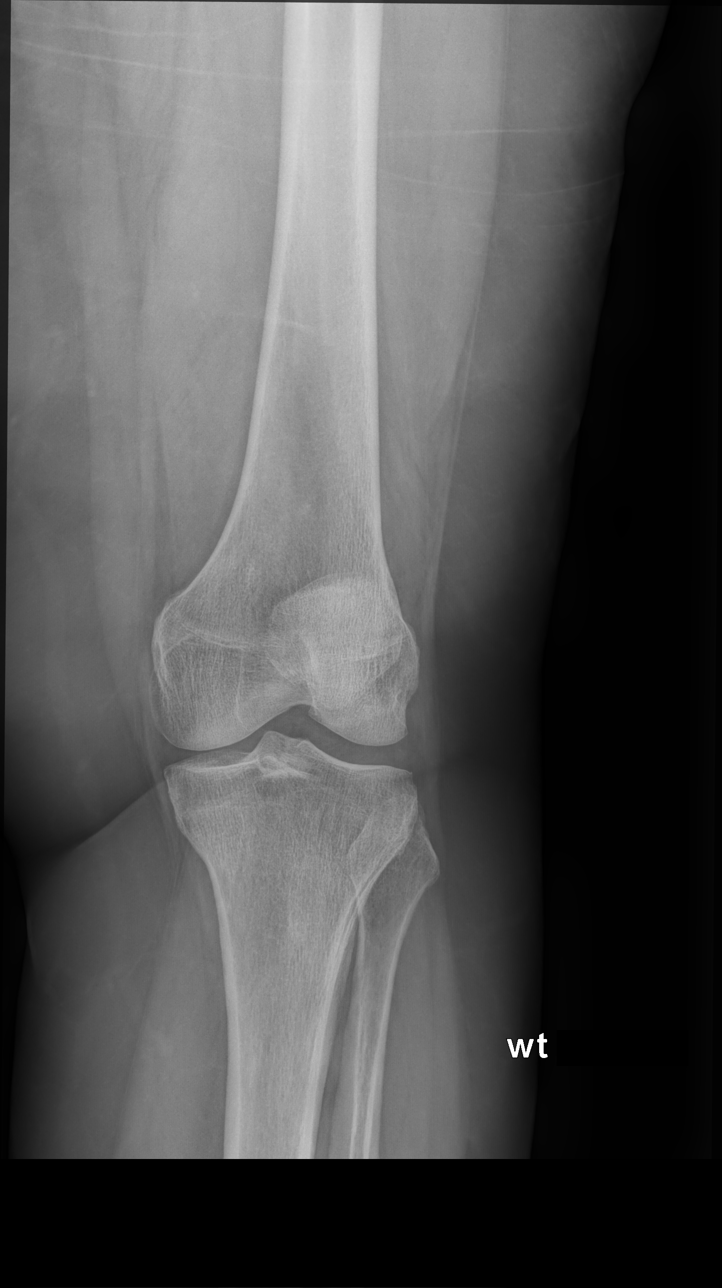

[knee lat]
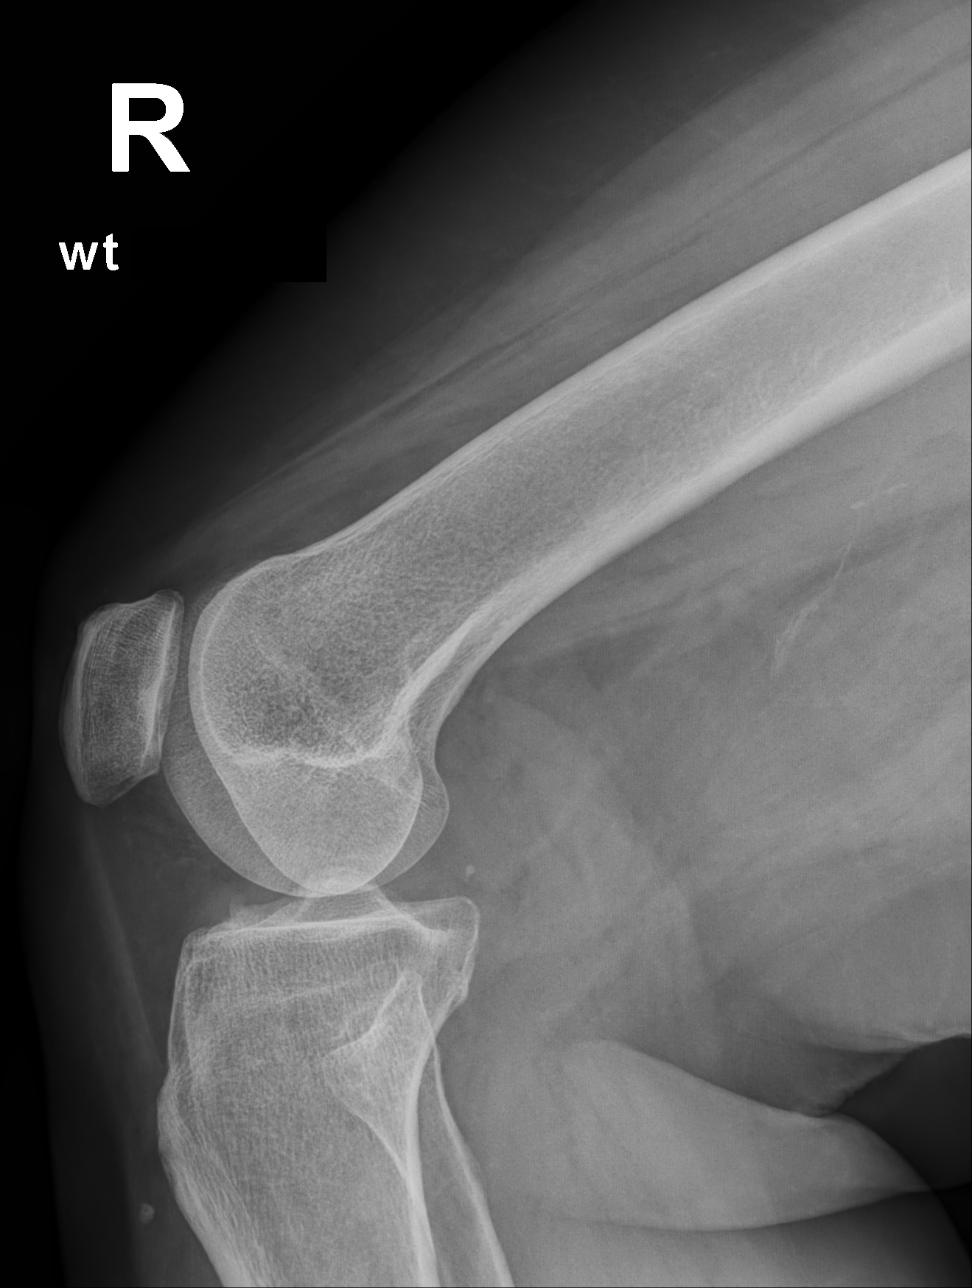

[patella (sunrise)]
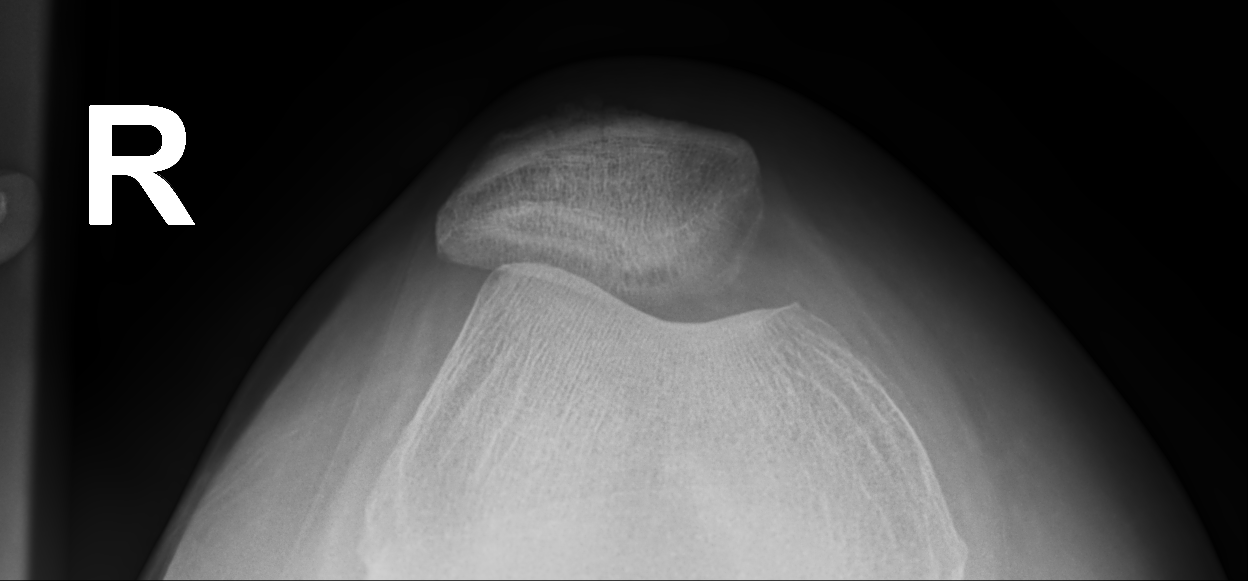

[4 of 4 positions shown; findings below may reference images not displayed]

FINDINGS: Mild degenerative change. No acute abnormality. No evidence of
fracture dislocation. No effusion noted.
IMPRESSION: Mild degenerative change. No acute bony abnormality. No evidence of
fracture or dislocation.

## 2020-03-22 ENCOUNTER — Ambulatory Visit (INDEPENDENT_AMBULATORY_CARE_PROVIDER_SITE_OTHER): Payer: No Typology Code available for payment source | Admitting: Family Medicine

## 2020-03-22 ENCOUNTER — Encounter: Payer: Self-pay | Admitting: Family Medicine

## 2020-03-22 ENCOUNTER — Other Ambulatory Visit: Payer: Self-pay

## 2020-03-22 ENCOUNTER — Ambulatory Visit: Payer: Self-pay

## 2020-03-22 VITALS — BP 121/80 | HR 75 | Ht 65.0 in

## 2020-03-22 DIAGNOSIS — M76822 Posterior tibial tendinitis, left leg: Secondary | ICD-10-CM

## 2020-03-22 DIAGNOSIS — M79672 Pain in left foot: Secondary | ICD-10-CM

## 2020-03-22 MED ORDER — PREDNISONE 5 MG PO TABS: ORAL_TABLET | ORAL | 0 refills | Status: DC

## 2020-03-22 NOTE — Assessment & Plan Note (Signed)
Significant inflammatory change and effusion on exam.  She has pes planus which is likely contributing. -Placed in cam walker with heel lift and scaphoid pad. -Cam walker. -Counseled supportive care. -Counseled on weaning out of the Cam walker. -Could consider injection or physical therapy.

## 2020-03-22 NOTE — Patient Instructions (Signed)
Good to see you Happy belated [place] Ouch! Please try the prednisone Please try the exercises once the pain has improved  You can try weaning out of the boot once the pain has improved    Please send me a message in MyChart with any questions or updates.  Please see me back in 3 weeks.   --Dr. Jordan Likes

## 2020-03-22 NOTE — Progress Notes (Signed)
Caitlin Pitts - 64 y.o. female MRN [id number]  Date of birth: Dec 18, 1955  SUBJECTIVE:  Including CC & ROS.  Chief Complaint  Patient presents with  . Ankle Pain    left x 3 weeks    Caitlin Pitts is a 64 y.o. female that is presenting with left ankle pain.  She experiences worse pain through the course of the day.  Will get swelling from time to time.  Has been ongoing for a few weeks.  Has tried ibuprofen.  No inciting event or injury.  Seems worse with walking downstairs and help..  Independent review of the left ankle x-ray from 2019 shows mild degenerative changes of the subtalar joint.   Review of Systems See HPI   HISTORY: Past Medical, Surgical, Social, and Family History Reviewed & Updated per EMR.   Pertinent Historical Findings include:  Past Medical History:  Diagnosis Date  . Esophageal reflux 10/28/2015  . Major depression 10/28/2015  . Psoriasis 10/28/2015  . Pure hypercholesterolemia 10/28/2015    Past Surgical History:  Procedure Laterality Date  . CHOLECYSTECTOMY, LAPAROSCOPIC    . FOOT SURGERY    . HAND SURGERY      Family History  Problem Relation Age of Onset  . Migraines Mother   . Hypercholesterolemia Mother   . Cancer Mother   . Cancer Maternal Grandmother        lungcancer  . Cancer Paternal Grandmother        colon cancer    Social History   Socioeconomic History  . Marital status: Divorced    Spouse name: Not on file  . Number of children: Not on file  . Years of education: Not on file  . Highest education level: Not on file  Occupational History  . Not on file  Tobacco Use  . Smoking status: Former Games developer  . Smokeless tobacco: Never Used  Vaping Use  . Vaping Use: Every day  . Substances: Nicotine  Substance and Sexual Activity  . Alcohol use: Not Currently  . Drug use: Never  . Sexual activity: Not Currently  Other Topics Concern  . Not on file  Social History Narrative  . Not on file   Social Determinants of Health    Financial Resource Strain:   . Difficulty of Paying Living Expenses:   Food Insecurity:   . Worried About Programme researcher, broadcasting/film/video in the Last Year:   . Barista in the Last Year:   Transportation Needs:   . Freight forwarder (Medical):   Marland Kitchen Lack of Transportation (Non-Medical):   Physical Activity:   . Days of Exercise per Week:   . Minutes of Exercise per Session:   Stress:   . Feeling of Stress :   Social Connections:   . Frequency of Communication with Friends and Family:   . Frequency of Social Gatherings with Friends and Family:   . Attends Religious Services:   . Active Member of Clubs or Organizations:   . Attends Banker Meetings:   Marland Kitchen Marital Status:   Intimate Partner Violence:   . Fear of Current or Ex-Partner:   . Emotionally Abused:   Marland Kitchen Physically Abused:   . Sexually Abused:      PHYSICAL EXAM:  VS: BP 121/80   Pulse 75   Ht 5\' 5"  (1.651 m)   BMI 38.17 kg/m  Physical Exam Gen: NAD, alert, cooperative with exam, well-appearing MSK:  Left ankle and foot: No redness. Obvious  swelling. Pes planus significantly. Normal range of motion. Tenderness to palpation at the navicular. Neurovascularly intact  Limited ultrasound: Left foot and ankle:  Large effusion proximal to the tarsal tunnel encompassing the posterior tibialis. Effusion noted and increased hyperemia associated with the posterior tibialis through the tarsal tunnel. No tearing of the posterior tibialis. Has hyperemia throughout the length of the tendon with no significant change at the insertion.  Summary: Posterior tibialis tendinitis.  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  She has pes planus which is likely contributing. -Placed in cam walker with heel lift and scaphoid pad. -Cam walker. -Counseled supportive  care. -Counseled on weaning out of the Cam walker. -Could consider injection or physical therapy.

## 2020-04-01 ENCOUNTER — Encounter: Payer: Self-pay | Admitting: Family Medicine

## 2020-04-02 ENCOUNTER — Encounter: Payer: Self-pay | Admitting: Family Medicine

## 2020-04-12 ENCOUNTER — Ambulatory Visit: Payer: No Typology Code available for payment source | Admitting: Family Medicine

## 2020-04-19 ENCOUNTER — Ambulatory Visit (INDEPENDENT_AMBULATORY_CARE_PROVIDER_SITE_OTHER): Payer: No Typology Code available for payment source | Admitting: Family Medicine

## 2020-04-19 ENCOUNTER — Other Ambulatory Visit: Payer: Self-pay

## 2020-04-19 VITALS — BP 120/78 | Ht 65.0 in | Wt 230.0 lb

## 2020-04-19 DIAGNOSIS — M76822 Posterior tibial tendinitis, left leg: Secondary | ICD-10-CM | POA: Diagnosis not present

## 2020-04-19 MED ORDER — PREDNISONE 20 MG PO TABS: ORAL_TABLET | ORAL | 0 refills | Status: DC

## 2020-04-19 NOTE — Patient Instructions (Signed)
Good to see you Good luck painting and have fun on your trip  Please try the medicine  Please use the boot intermittently.  I will call with the results   Please send me a message in MyChart with any questions or updates.  Please see me back in 4 weeks.   --Dr. Jordan Likes

## 2020-04-19 NOTE — Progress Notes (Signed)
Caitlin Pitts - 64 y.o. female MRN [id number]  Date of birth: 06/17/1956  SUBJECTIVE:  Including CC & ROS.  No chief complaint on file.   Caitlin Pitts is a 64 y.o. female that is presenting with worsening of her left ankle pain.  She does get improvement with the cam walker.  The prednisone helped near the end of the course.   Review of Systems See HPI   HISTORY: Past Medical, Surgical, Social, and Family History Reviewed & Updated per EMR.   Pertinent Historical Findings include:  Past Medical History:  Diagnosis Date  . Esophageal reflux 10/28/2015  . Major depression 10/28/2015  . Psoriasis 10/28/2015  . Pure hypercholesterolemia 10/28/2015    Past Surgical History:  Procedure Laterality Date  . CHOLECYSTECTOMY, LAPAROSCOPIC    . FOOT SURGERY    . HAND SURGERY      Family History  Problem Relation Age of Onset  . Migraines Mother   . Hypercholesterolemia Mother   . Cancer Mother   . Cancer Maternal Grandmother        lungcancer  . Cancer Paternal Grandmother        colon cancer    Social History   Socioeconomic History  . Marital status: Divorced    Spouse name: Not on file  . Number of children: Not on file  . Years of education: Not on file  . Highest education level: Not on file  Occupational History  . Not on file  Tobacco Use  . Smoking status: Former Games developer  . Smokeless tobacco: Never Used  Vaping Use  . Vaping Use: Every day  . Substances: Nicotine  Substance and Sexual Activity  . Alcohol use: Not Currently  . Drug use: Never  . Sexual activity: Not Currently  Other Topics Concern  . Not on file  Social History Narrative  . Not on file   Social Determinants of Health   Financial Resource Strain:   . Difficulty of Paying Living Expenses: Not on file  Food Insecurity:   . Worried About Programme researcher, broadcasting/film/video in the Last Year: Not on file  . Ran Out of Food in the Last Year: Not on file  Transportation Needs:   . Lack of Transportation  (Medical): Not on file  . Lack of Transportation (Non-Medical): Not on file  Physical Activity:   . Days of Exercise per Week: Not on file  . Minutes of Exercise per Session: Not on file  Stress:   . Feeling of Stress : Not on file  Social Connections:   . Frequency of Communication with Friends and Family: Not on file  . Frequency of Social Gatherings with Friends and Family: Not on file  . Attends Religious Services: Not on file  . Active Member of Clubs or Organizations: Not on file  . Attends Banker Meetings: Not on file  . Marital Status: Not on file  Intimate Partner Violence:   . Fear of Current or Ex-Partner: Not on file  . Emotionally Abused: Not on file  . Physically Abused: Not on file  . Sexually Abused: Not on file     PHYSICAL EXAM:  VS: BP 120/78   Ht 5\' 5"  (1.651 m)   Wt 230 lb (104.3 kg)   BMI 38.27 kg/m  Physical Exam Gen: NAD, alert, cooperative with exam, well-appearing MSK:  Left ankle: Swelling has improved. No ecchymosis. Tenderness to palpation along posterior tibialis. Normal strength resistance. 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 -Counseled on intermittent use of the cam walker. -Prednisone. -Counseled on supportive care. -Uric acid, CRP, sed rate, ANA panel

## 2020-04-21 NOTE — Assessment & Plan Note (Signed)
She does have a history of psoriasis and unclear if this is more an inflammatory process as opposed to a tendinitis from overuse and her pes planus. -Counseled on intermittent use of the cam walker. -Prednisone. -Counseled on supportive care. -Uric acid, CRP, sed rate, ANA panel

## 2020-04-24 ENCOUNTER — Telehealth: Payer: Self-pay | Admitting: Family Medicine

## 2020-04-24 LAB — SEDIMENTATION RATE: Sed Rate: 41 mm/hr — ABNORMAL HIGH (ref 0–40)

## 2020-04-24 LAB — C-REACTIVE PROTEIN: CRP: 6 mg/L (ref 0–10)

## 2020-04-24 LAB — ANA,IFA RA DIAG PNL W/RFLX TIT/PATN
ANA Titer 1: NEGATIVE
Cyclic Citrullin Peptide Ab: 9 units (ref 0–19)
Rheumatoid fact SerPl-aCnc: <10 IU/mL (ref 0.0–13.9)

## 2020-04-24 LAB — URIC ACID: Uric Acid: 6.2 mg/dL (ref 3.0–7.2)

## 2020-04-24 NOTE — Telephone Encounter (Signed)
Informed of results. May have a gout origin. Could consider indocin after prednisone.   Myra Rude, MD [facility] 04/24/2020, 5:10 PM

## 2020-05-01 ENCOUNTER — Encounter: Payer: Self-pay | Admitting: Family Medicine

## 2020-05-01 ENCOUNTER — Other Ambulatory Visit: Payer: Self-pay | Admitting: Family Medicine

## 2020-05-01 DIAGNOSIS — M76822 Posterior tibial tendinitis, left leg: Secondary | ICD-10-CM

## 2020-05-01 NOTE — Progress Notes (Signed)
Patient still having pain in the medical compartment of the ankle. Has had inflammatory changes over the posterior tibialis. Concern for partial tearing with being non responsive to treatment. Will order xray and proceed with MRI.   Myra Rude, MD [facility] 05/01/2020, 11:53 AM

## 2020-05-02 ENCOUNTER — Ambulatory Visit (HOSPITAL_BASED_OUTPATIENT_CLINIC_OR_DEPARTMENT_OTHER)
Admission: RE | Admit: 2020-05-02 | Discharge: 2020-05-02 | Disposition: A | Discharge status: Home / Self Care | Payer: No Typology Code available for payment source | Source: Ambulatory Visit | Attending: Family Medicine | Admitting: Family Medicine

## 2020-05-02 ENCOUNTER — Other Ambulatory Visit: Payer: Self-pay

## 2020-05-02 ENCOUNTER — Telehealth: Payer: Self-pay | Admitting: Family Medicine

## 2020-05-02 DIAGNOSIS — M76822 Posterior tibial tendinitis, left leg: Secondary | ICD-10-CM | POA: Diagnosis not present

## 2020-05-02 NOTE — Addendum Note (Signed)
Addended by: Kathi Simpers F on: 05/02/2020 11:43 AM   Modules accepted: Orders

## 2020-05-02 NOTE — Telephone Encounter (Signed)
Called pt to verify who she was talking about in her Mychart msg listing Alyssa @ [employer] adjuster rep @ fax# [phone].  --Pt's Ins cvg listed as [employer]/ [employer] PPO ntwk, but she states HSA benefit cover MRI/CT scans @ 100% all we need to do is fax order to them @ [phone] & they will set up MRI  Appointment as well as pay for the Svcs completely.  Alyssa can be reached @ [phone] @ [employer] benefits office.  --glh

## 2020-05-03 ENCOUNTER — Telehealth: Payer: Self-pay | Admitting: Family Medicine

## 2020-05-03 NOTE — Telephone Encounter (Signed)
Left VM for patient. 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 .   If any questions then please take the best time and phone number to call and I will try to call her back.   Myra Rude, MD [facility] 05/03/2020, 8:45 AM

## 2020-05-16 ENCOUNTER — Telehealth (INDEPENDENT_AMBULATORY_CARE_PROVIDER_SITE_OTHER): Payer: No Typology Code available for payment source | Admitting: Family Medicine

## 2020-05-16 ENCOUNTER — Other Ambulatory Visit: Payer: Self-pay

## 2020-05-16 DIAGNOSIS — M76822 Posterior tibial tendinitis, left leg: Secondary | ICD-10-CM | POA: Diagnosis not present

## 2020-05-16 NOTE — Assessment & Plan Note (Signed)
MRI was confirming for severe tendinosis and partial tear of the posterior tibialis.  This was also demonstrating severe tenosynovitis.  She also has reactive changes in the medial malleolus.  We have tried several conservative measures thus far with limited improvement. -Continue cam walker. -Follow-up for injection.

## 2020-05-16 NOTE — Progress Notes (Signed)
Virtual Visit via Telephone Note  I connected with Caitlin Pitts on 05/16/20 at  8:10 AM EDT by telephone and verified that I am speaking with the correct person using two identifiers.   I discussed the limitations, risks, security and privacy concerns of performing an evaluation and management service by telephone and the availability of in person appointments. I also discussed with the patient that there may be a patient responsible charge related to this service. The patient expressed understanding and agreed to proceed.  Patient: home  Physician: office   History of Present Illness:  Caitlin Pitts is a 64 year old female is following up for left foot and ankle pain.  An MRI was demonstrating severe tendinosis and partial tear of the posterior tibialis tendon with severe tenosynovitis.  She also has reactive changes of the medial malleolus.   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  This was also demonstrating severe tenosynovitis.  She also has reactive changes in the medial malleolus.  We have tried several conservative measures thus far with limited improvement. -Continue cam walker. -Follow-up for injection.  Follow Up Instructions:    I discussed the assessment and treatment plan with the patient. The patient was provided an opportunity to ask questions and all were answered. The patient agreed with the plan and demonstrated an understanding of the instructions.   The patient was advised to call back or seek an in-person evaluation if the symptoms worsen or if the condition fails to improve as anticipated.  I provided 8 minutes of non-face-to-face time during this encounter.   [name], MD

## 2020-05-20 ENCOUNTER — Telehealth: Payer: No Typology Code available for payment source | Admitting: Family Medicine

## 2020-05-23 ENCOUNTER — Ambulatory Visit: Payer: Self-pay

## 2020-05-23 ENCOUNTER — Other Ambulatory Visit: Payer: Self-pay

## 2020-05-23 ENCOUNTER — Ambulatory Visit (INDEPENDENT_AMBULATORY_CARE_PROVIDER_SITE_OTHER): Payer: No Typology Code available for payment source | Admitting: Family Medicine

## 2020-05-23 ENCOUNTER — Encounter: Payer: Self-pay | Admitting: Family Medicine

## 2020-05-23 VITALS — BP 118/79 | HR 66 | Ht 65.0 in

## 2020-05-23 DIAGNOSIS — M76822 Posterior tibial tendinitis, left leg: Secondary | ICD-10-CM

## 2020-05-23 MED ORDER — METHYLPREDNISOLONE ACETATE 40 MG/ML IJ SUSP
40.0000 mg | Freq: Once | INTRAMUSCULAR | Status: AC
  Administered 2020-05-23: 40 mg via INTRA_ARTICULAR

## 2020-05-23 NOTE — Patient Instructions (Signed)
Good to see you Please continue the boot for 2 more weeks  Try to avoid walking barefoot  Please try ice if needed   Please send me a message in MyChart with any questions or updates.  Please let me know how you feel when you come out of the boot.   --Dr. Jordan Likes

## 2020-05-23 NOTE — Progress Notes (Signed)
Caitlin Pitts - 64 y.o. female MRN [id number]  Date of birth: 1956/03/14  SUBJECTIVE:  Including CC & ROS.  Chief Complaint  Patient presents with  . Follow-up    left ankle    Caitlin Pitts is a 64 y.o. female that is presenting with ongoing left ankle and foot pain.  She feels the pain is similar to when she first received treatment.  She did get initial improvement with a fairly high dose of prednisone.   Review of Systems See HPI   HISTORY: Past Medical, Surgical, Social, and Family History Reviewed & Updated per EMR.   Pertinent Historical Findings include:  Past Medical History:  Diagnosis Date  . Esophageal reflux 10/28/2015  . Major depression 10/28/2015  . Psoriasis 10/28/2015  . Pure hypercholesterolemia 10/28/2015    Past Surgical History:  Procedure Laterality Date  . CHOLECYSTECTOMY, LAPAROSCOPIC    . FOOT SURGERY    . HAND SURGERY      Family History  Problem Relation Age of Onset  . Migraines Mother   . Hypercholesterolemia Mother   . Cancer Mother   . Cancer Maternal Grandmother        lungcancer  . Cancer Paternal Grandmother        colon cancer    Social History   Socioeconomic History  . Marital status: Divorced    Spouse name: Not on file  . Number of children: Not on file  . Years of education: Not on file  . Highest education level: Not on file  Occupational History  . Not on file  Tobacco Use  . Smoking status: Former Games developer  . Smokeless tobacco: Never Used  Vaping Use  . Vaping Use: Every day  . Substances: Nicotine  Substance and Sexual Activity  . Alcohol use: Not Currently  . Drug use: Never  . Sexual activity: Not Currently  Other Topics Concern  . Not on file  Social History Narrative  . Not on file   Social Determinants of Health   Financial Resource Strain:   . Difficulty of Paying Living Expenses: Not on file  Food Insecurity:   . Worried About Programme researcher, broadcasting/film/video in the Last Year: Not on file  . Ran Out of Food  in the Last Year: Not on file  Transportation Needs:   . Lack of Transportation (Medical): Not on file  . Lack of Transportation (Non-Medical): Not on file  Physical Activity:   . Days of Exercise per Week: Not on file  . Minutes of Exercise per Session: Not on file  Stress:   . Feeling of Stress : Not on file  Social Connections:   . Frequency of Communication with Friends and Family: Not on file  . Frequency of Social Gatherings with Friends and Family: Not on file  . Attends Religious Services: Not on file  . Active Member of Clubs or Organizations: Not on file  . Attends Banker Meetings: Not on file  . Marital Status: Not on file  Intimate Partner Violence:   . Fear of Current or Ex-Partner: Not on file  . Emotionally Abused: Not on file  . Physically Abused: Not on file  . Sexually Abused: Not on file     PHYSICAL EXAM:  VS: BP 118/79   Pulse 66   Ht 5\' 5"  (1.651 m)   BMI 38.27 kg/m  Physical Exam Gen: NAD, alert, cooperative with exam, well-appearing   Aspiration/Injection Procedure Note Caitlin Pitts [birth year]  Procedure: Injection Indications: Left ankle pain, posterior tibialis tendinitis  Procedure Details Consent: Risks of procedure as well as the alternatives and risks of each were explained to the (patient/caregiver).  Consent for procedure obtained. Time Out: Verified patient identification, verified procedure, site/side was marked, verified correct patient position, special equipment/implants available, medications/allergies/relevent history reviewed, required imaging and test results available.  Performed.  The area was cleaned with iodine and alcohol swabs.    The left posterior tibialis tendon sheath was injected using 1 cc's of 40 mg Depo-Medrol and 2 cc's of 0.25% bupivacaine with a 25 1 1/2" needle.  Ultrasound was used. Images were obtained in long views showing the injection.     A sterile dressing was applied.  Patient did  tolerate procedure well.      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  Little improvement with therapy thus far. -Injection. -Has an insole and a scaphoid pad was provided.  Continue cam walker for another 2 weeks and then wean out of it.  Can transition to an Aircast. -May need to consider referral to rheumatology.

## 2020-05-23 NOTE — Assessment & Plan Note (Signed)
Symptoms are still ongoing.  Little improvement with therapy thus far. -Injection. -Has an insole and a scaphoid pad was provided.  Continue cam walker for another 2 weeks and then wean out of it.  Can transition to an Aircast. -May need to consider referral to rheumatology.

## 2020-06-18 ENCOUNTER — Encounter: Payer: Self-pay | Admitting: Family Medicine

## 2020-11-22 ENCOUNTER — Ambulatory Visit: Payer: No Typology Code available for payment source | Admitting: Family Medicine

## 2020-11-27 ENCOUNTER — Encounter: Payer: Self-pay | Admitting: Family Medicine

## 2020-11-28 ENCOUNTER — Other Ambulatory Visit: Payer: Self-pay

## 2020-11-28 DIAGNOSIS — F3341 Major depressive disorder, recurrent, in partial remission: Secondary | ICD-10-CM

## 2020-11-28 MED ORDER — ESCITALOPRAM OXALATE 10 MG PO TABS: 10.0000 mg | ORAL_TABLET | Freq: Every day | ORAL | 0 refills | Status: DC

## 2020-11-28 MED ORDER — BUPROPION HCL ER (XL) 300 MG PO TB24: 300.0000 mg | ORAL_TABLET | Freq: Every day | ORAL | 0 refills | Status: DC

## 2020-12-13 ENCOUNTER — Encounter: Payer: Self-pay | Admitting: Family Medicine

## 2020-12-13 ENCOUNTER — Ambulatory Visit (INDEPENDENT_AMBULATORY_CARE_PROVIDER_SITE_OTHER): Payer: No Typology Code available for payment source | Admitting: Family Medicine

## 2020-12-13 ENCOUNTER — Other Ambulatory Visit: Payer: Self-pay

## 2020-12-13 VITALS — BP 100/76 | HR 75 | Temp 97.7°F | Ht 64.0 in | Wt 229.2 lb

## 2020-12-13 DIAGNOSIS — Z87891 Personal history of nicotine dependence: Secondary | ICD-10-CM

## 2020-12-13 DIAGNOSIS — F3341 Major depressive disorder, recurrent, in partial remission: Secondary | ICD-10-CM

## 2020-12-13 DIAGNOSIS — Z23 Encounter for immunization: Secondary | ICD-10-CM | POA: Diagnosis not present

## 2020-12-13 DIAGNOSIS — Z1321 Encounter for screening for nutritional disorder: Secondary | ICD-10-CM

## 2020-12-13 DIAGNOSIS — E7800 Pure hypercholesterolemia, unspecified: Secondary | ICD-10-CM | POA: Diagnosis not present

## 2020-12-13 DIAGNOSIS — Z800 Family history of malignant neoplasm of digestive organs: Secondary | ICD-10-CM

## 2020-12-13 DIAGNOSIS — Z0000 Encounter for general adult medical examination without abnormal findings: Secondary | ICD-10-CM

## 2020-12-13 DIAGNOSIS — Z1211 Encounter for screening for malignant neoplasm of colon: Secondary | ICD-10-CM

## 2020-12-13 LAB — LIPID PANEL
Cholesterol: 220 mg/dL — ABNORMAL HIGH (ref 0–200)
HDL: 61.2 mg/dL (ref >39.00)
NonHDL: 158.83
Total CHOL/HDL Ratio: 4
Triglycerides: 204 mg/dL — ABNORMAL HIGH (ref 0.0–149.0)
VLDL: 40.8 mg/dL — ABNORMAL HIGH (ref 0.0–40.0)

## 2020-12-13 LAB — CBC
HCT: 40.2 % (ref 36.0–46.0)
Hemoglobin: 13.4 g/dL (ref 12.0–15.0)
MCHC: 33.2 g/dL (ref 30.0–36.0)
MCV: 92.5 fl (ref 78.0–100.0)
Platelets: 214 10*3/uL (ref 150.0–400.0)
RBC: 4.35 Mil/uL (ref 3.87–5.11)
RDW: 12.2 % (ref 11.5–15.5)
WBC: 6.5 10*3/uL (ref 4.0–10.5)

## 2020-12-13 LAB — BASIC METABOLIC PANEL
BUN: 18 mg/dL (ref 6–23)
CO2: 29 mEq/L (ref 19–32)
Calcium: 9.7 mg/dL (ref 8.4–10.5)
Chloride: 103 mEq/L (ref 96–112)
Creatinine, Ser: 1.01 mg/dL (ref 0.40–1.20)
GFR: 58.74 mL/min — ABNORMAL LOW (ref >60.00)
Glucose, Bld: 96 mg/dL (ref 70–99)
Potassium: 4.9 mEq/L (ref 3.5–5.1)
Sodium: 138 mEq/L (ref 135–145)

## 2020-12-13 LAB — ALT: ALT: 24 U/L (ref 0–35)

## 2020-12-13 LAB — AST: AST: 21 U/L (ref 0–37)

## 2020-12-13 LAB — VITAMIN D 25 HYDROXY (VIT D DEFICIENCY, FRACTURES): VITD: 20.24 ng/mL — ABNORMAL LOW (ref 30.00–100.00)

## 2020-12-13 LAB — LDL CHOLESTEROL, DIRECT: Direct LDL: 143 mg/dL

## 2020-12-13 MED ORDER — ESCITALOPRAM OXALATE 10 MG PO TABS: 10.0000 mg | ORAL_TABLET | Freq: Every day | ORAL | 3 refills | Status: DC

## 2020-12-13 NOTE — Progress Notes (Signed)
Caitlin Pitts is a 65 y.o. female  Chief Complaint  Patient presents with  . Annual Exam    Pt is fasting for lab work, pt need a referral for mammogram.  Pt not utd on pap, she has a GYN.  Pt has some questions about vaccines.    HPI: Caitlin Pitts is a 65 y.o. female patient seen today for annual CPE, fasting labs.  She follows with Dr. Rana Snare at [facility] for her GYN care. Pt needs to schedule appt.   Last colonoscopy: every 5 years, overdue  Dental: UTD Vision: wears glasses and UTD; macular degeneration w/o symptoms  Med refills needed today? Yes per orders Will get shingles vaccine today.   Past Medical History:  Diagnosis Date  . Esophageal reflux 10/28/2015  . Major depression 10/28/2015  . Psoriasis 10/28/2015  . Pure hypercholesterolemia 10/28/2015    Past Surgical History:  Procedure Laterality Date  . CHOLECYSTECTOMY, LAPAROSCOPIC    . FOOT SURGERY    . HAND SURGERY      Social History   Socioeconomic History  . Marital status: Divorced    Spouse name: Not on file  . Number of children: Not on file  . Years of education: Not on file  . Highest education level: Not on file  Occupational History  . Not on file  Tobacco Use  . Smoking status: Former Games developer  . Smokeless tobacco: Never Used  Vaping Use  . Vaping Use: Every day  . Substances: Nicotine  Substance and Sexual Activity  . Alcohol use: Not Currently  . Drug use: Never  . Sexual activity: Not Currently  Other Topics Concern  . Not on file  Social History Narrative  . Not on file   Social Determinants of Health   Financial Resource Strain: Not on file  Food Insecurity: Not on file  Transportation Needs: Not on file  Physical Activity: Not on file  Stress: Not on file  Social Connections: Not on file  Intimate Partner Violence: Not on file    Family History  Problem Relation Age of Onset  . Migraines Mother   . Hypercholesterolemia Mother   . Cancer Mother   .  Cancer Maternal Grandmother        lungcancer  . Cancer Paternal Grandmother        colon cancer     Immunization History  Administered Date(s) Administered  . Influenza,inj,Quad PF,6+ Mos 06/16/2019  . Tdap 03/20/2019    Outpatient Encounter Medications as of 12/13/2020  Medication Sig Note  . buPROPion (WELLBUTRIN XL) 300 MG 24 hr tablet Take 1 tablet (300 mg total) by mouth daily.   . calcipotriene (DOVONOX) 0.005 % cream APPLY CREAM TOPICALLY TWICE DAILY   . diazepam (VALIUM) 2 MG tablet Take 1 tablet prior to flying. 04/28/2016: Received from: [facility]  . Diclofenac Sodium (PENNSAID) 2 % SOLN Place 1 application onto the skin 2 (two) times daily.   . [DISCONTINUED] escitalopram (LEXAPRO) 10 MG tablet Take 1 tablet (10 mg total) by mouth daily.   Marland Kitchen escitalopram (LEXAPRO) 10 MG tablet Take 1 tablet (10 mg total) by mouth daily.   Marland Kitchen OTEZLA 30 MG TABS Take 1 tablet by mouth 2 (two) times daily.   . [DISCONTINUED] folic acid (FOLVITE) 1 MG tablet Take 1 mg by mouth daily. (Patient not taking: Reported on 12/13/2020)   . [DISCONTINUED] methotrexate (RHEUMATREX) 2.5 MG tablet    . [DISCONTINUED] predniSONE (DELTASONE) 20  MG tablet TAKE 3 TABS ONCE DAILY FOR 3 DAYS, 2 FOR 3 DAYS, 1 FOR 2 DAYS THEN 1/2 FOR 2 DAYS    No facility-administered encounter medications on file as of 12/13/2020.     ROS: Pertinent positives and negatives noted in HPI. Remainder of ROS non-contributory   Allergies  Allergen Reactions  . Aspirin Swelling  . Desvenlafaxine Rash  . Penicillins Rash  . Phentermine Rash    BP 100/76 (BP Location: Right Arm)   Pulse 75   Temp 97.7 F (36.5 C) (Oral)   Ht 5\' 4"  (1.626 m)   Wt 229 lb 3.2 oz (104 kg)   SpO2 99%   BMI 39.34 kg/m   Wt Readings from Last 3 Encounters:  12/13/20 229 lb 3.2 oz (104 kg)  04/19/20 230 lb (104.3 kg)  06/16/19 229 lb 6.4 oz (104.1 kg)   Temp Readings from Last 3 Encounters:  12/13/20 97.7 F (36.5 C)  (Oral)  06/16/19 97.8 F (36.6 C) (Tympanic)  05/12/19 98 F (36.7 C) (Oral)   BP Readings from Last 3 Encounters:  12/13/20 100/76  05/23/20 118/79  04/19/20 120/78   Pulse Readings from Last 3 Encounters:  12/13/20 75  05/23/20 66  03/22/20 75     Physical Exam Constitutional:      General: She is not in acute distress.    Appearance: She is well-developed.  HENT:     Head: Normocephalic and atraumatic.     Right Ear: Tympanic membrane and ear canal normal.     Left Ear: Tympanic membrane and ear canal normal.     Nose: Nose normal.     Mouth/Throat:     Mouth: Mucous membranes are moist.     Pharynx: Oropharynx is clear.  Eyes:     Conjunctiva/sclera: Conjunctivae normal.  Neck:     Thyroid: No thyromegaly.  Cardiovascular:     Rate and Rhythm: Normal rate and regular rhythm.     Pulses: Normal pulses.     Heart sounds: No murmur heard.   Pulmonary:     Effort: Pulmonary effort is normal. No respiratory distress.     Breath sounds: Normal breath sounds. No wheezing or rhonchi.  Abdominal:     General: Bowel sounds are normal. There is no distension.     Palpations: Abdomen is soft. There is no mass.     Tenderness: There is no abdominal tenderness.  Musculoskeletal:     Cervical back: Neck supple.     Right lower leg: No edema.     Left lower leg: No edema.  Lymphadenopathy:     Cervical: No cervical adenopathy.  Skin:    General: Skin is warm.     Findings: Rash (psoriasis on B/L LE Rt>Lt) present.  Neurological:     Mental Status: She is alert and oriented to person, place, and time.     Motor: No abnormal muscle tone.     Coordination: Coordination normal.  Psychiatric:        Behavior: Behavior normal.      A/P:  1. Annual physical exam - discussed importance of regular CV exercise, healthy diet, adequate sleep - UTD on dental and vision exams - PAP, mammo, dexa with GYN Dr. 05/22/20 - referral for surveillance colonoscopy placed today -  immunizations UTD - see #7 below - ALT - AST - Basic metabolic panel - CBC - next CPE in 1 year  2. Recurrent major depressive disorder, in partial remission (HCC) - stable,  controlled Refill: - escitalopram (LEXAPRO) 10 MG tablet; Take 1 tablet (10 mg total) by mouth daily.  Dispense: 90 tablet; Refill: 3 - pt plans to taper off wellbutrin 300mg  daily d/t not being effective. Will take 150mg  x 2wks then stop  3. Pure hypercholesterolemia - ALT - AST - Lipid panel  4. Encounter for vitamin deficiency screening - VITAMIN D 25 Hydroxy (Vit-D Deficiency, Fractures)  5. Screening for colon cancer 6. Family history of colon cancer - Ambulatory referral to Gastroenterology  7. Need for shingles vaccine - Varicella-zoster vaccine IM (Shingrix) - RTO in 2-39mo for #2  8. History of tobacco use - CT CHEST LUNG CA SCREEN LOW DOSE W/O CM; Future   This visit occurred during the SARS-CoV-2 public health emergency.  Safety protocols were in place, including screening questions prior to the visit, additional usage of staff PPE, and extensive cleaning of exam room while observing appropriate contact time as indicated for disinfecting solutions.

## 2020-12-24 ENCOUNTER — Ambulatory Visit: Payer: No Typology Code available for payment source

## 2021-01-02 ENCOUNTER — Other Ambulatory Visit: Payer: Self-pay | Admitting: Family Medicine

## 2021-01-02 ENCOUNTER — Encounter: Payer: Self-pay | Admitting: Family Medicine

## 2021-01-02 DIAGNOSIS — E559 Vitamin D deficiency, unspecified: Secondary | ICD-10-CM

## 2021-01-02 MED ORDER — VITAMIN D (ERGOCALCIFEROL) 1.25 MG (50000 UNIT) PO CAPS: 50000.0000 [IU] | ORAL_CAPSULE | ORAL | 2 refills | Status: DC

## 2021-01-09 ENCOUNTER — Encounter: Payer: Self-pay | Admitting: Family Medicine

## 2021-01-09 DIAGNOSIS — F419 Anxiety disorder, unspecified: Secondary | ICD-10-CM

## 2021-01-09 MED ORDER — BUPROPION HCL ER (XL) 150 MG PO TB24: 150.0000 mg | ORAL_TABLET | Freq: Every day | ORAL | 3 refills | Status: DC

## 2021-05-21 HISTORY — PX: EYE SURGERY: SHX253

## 2021-10-20 ENCOUNTER — Ambulatory Visit (INDEPENDENT_AMBULATORY_CARE_PROVIDER_SITE_OTHER): Payer: No Typology Code available for payment source | Admitting: Family Medicine

## 2021-10-20 ENCOUNTER — Ambulatory Visit: Payer: Self-pay

## 2021-10-20 VITALS — BP 122/78 | Ht 64.0 in | Wt 230.0 lb

## 2021-10-20 DIAGNOSIS — M654 Radial styloid tenosynovitis [de Quervain]: Secondary | ICD-10-CM

## 2021-10-20 MED ORDER — METHYLPREDNISOLONE ACETATE 40 MG/ML IJ SUSP
40.0000 mg | Freq: Once | INTRAMUSCULAR | Status: AC
  Administered 2021-10-20: 40 mg via INTRA_ARTICULAR

## 2021-10-20 NOTE — Progress Notes (Signed)
?  [name] - 66 y.o. female MRN [id number]  Date of birth: 1955-12-22 ? ?SUBJECTIVE:  Including CC & ROS.  ?No chief complaint on file. ? ? ?Caitlin Pitts is a 66 y.o. female that is presenting with right wrist pain.  The pain has been ongoing for few weeks.  It is occurring over the radial styloid.  It is worse with repetitive movements.  She has tried a splint with no improvement. ? ? ?Review of Systems ?See HPI  ? ?HISTORY: Past Medical, Surgical, Social, and Family History Reviewed & Updated per EMR.   ?Pertinent Historical Findings include: ? ?Past Medical History:  ?Diagnosis Date  ? Esophageal reflux 10/28/2015  ? Major depression 10/28/2015  ? Psoriasis 10/28/2015  ? Pure hypercholesterolemia 10/28/2015  ? ? ?Past Surgical History:  ?Procedure Laterality Date  ? CHOLECYSTECTOMY, LAPAROSCOPIC    ? FOOT SURGERY    ? HAND SURGERY    ? ? ? ?PHYSICAL EXAM:  ?VS: BP 122/78   Ht 5\' 4"  (1.626 m)   Wt 230 lb (104.3 kg)   BMI 39.48 kg/m?  ?Physical Exam ?Gen: NAD, alert, cooperative with exam, well-appearing ?MSK:  ?Neurovascularly intact   ? ?Limited ultrasound: Right wrist: ? ?There is a thickening and effusion of the first dorsal compartment as it travels over the radial styloid ? ?Summary: tenosynovitis ? ?Ultrasound and interpretation by [name], MD ? ?Aspiration/Injection Procedure Note ?[name] ?1955-10-26 ? ?Procedure: Injection ?Indications: Right wrist pain ? ?Procedure Details ?Consent: Risks of procedure as well as the alternatives and risks of each were explained to the (patient/caregiver).  Consent for procedure obtained. ?Time Out: Verified patient identification, verified procedure, site/side was marked, verified correct patient position, special equipment/implants available, medications/allergies/relevent history reviewed, required imaging and test results available.  Performed.  The area was cleaned with iodine and alcohol swabs.   ? ?The right first dorsal compartment  was injected using 1 cc's of 40 mg Depo-Medrol and 1 cc's of 0.25% bupivacaine with a 25 1 1/2" needle.  Ultrasound was used. Images were obtained in long views showing the injection.   ? ? ?A sterile dressing was applied. ? ?Patient did tolerate procedure well. ? ? ? ? ?ASSESSMENT & PLAN:  ? ?De Quervain's tenosynovitis, right ?Acutely occurring.  Having pain consistent with de Quervain's with positive Finkelstein's test. ?-Counseled on home exercise therapy and supportive care. ?-Injection today. ?- Counseled on splint. ?-Could consider physical therapy ? ? ? ? ?

## 2021-10-20 NOTE — Patient Instructions (Signed)
Good to see you ?Please use ice as needed  ?Please try the exercises  ?Please use the brace at night   ?Please send me a message in MyChart with any questions or updates.  ?Please see me back in 4-6 weeks.  ? ?--Dr. Jordan Likes ? ?

## 2021-10-20 NOTE — Assessment & Plan Note (Signed)
Acutely occurring.  Having pain consistent with de Quervain's with positive Finkelstein's test. ?-Counseled on home exercise therapy and supportive care. ?-Injection today. ?- Counseled on splint. ?-Could consider physical therapy ?

## 2021-11-14 ENCOUNTER — Encounter: Payer: Self-pay | Admitting: Family Medicine

## 2021-11-14 ENCOUNTER — Ambulatory Visit: Payer: No Typology Code available for payment source | Admitting: Family Medicine

## 2021-11-14 VITALS — BP 114/68 | HR 75 | Temp 97.8°F | Ht 64.0 in | Wt 234.8 lb

## 2021-11-14 DIAGNOSIS — F419 Anxiety disorder, unspecified: Secondary | ICD-10-CM

## 2021-11-14 DIAGNOSIS — L409 Psoriasis, unspecified: Secondary | ICD-10-CM

## 2021-11-14 DIAGNOSIS — F3340 Major depressive disorder, recurrent, in remission, unspecified: Secondary | ICD-10-CM | POA: Diagnosis not present

## 2021-11-14 MED ORDER — BUPROPION HCL ER (XL) 150 MG PO TB24: 150.0000 mg | ORAL_TABLET | Freq: Every day | ORAL | 3 refills | Status: DC

## 2021-11-14 MED ORDER — ESCITALOPRAM OXALATE 10 MG PO TABS: 10.0000 mg | ORAL_TABLET | Freq: Every day | ORAL | 3 refills | Status: DC

## 2021-11-14 NOTE — Progress Notes (Signed)
?[facility] [place] [facility] ?[facility] ?[address] ?[place] [address] ?Dept: [phone] ?Dept Fax: [phone] ? ?Transfer of Care Office Visit ? ?Subjective:  ? ? Patient ID: Caitlin Pitts, female    DOB: 1956-08-03, 66 y.o..   MRN: [id number] ? ?Chief Complaint  ?Patient presents with  ? Establish Care  ?  TOC- establish care.  No concerns.  ? ? ?History of Present Illness: ? ?Patient is in today to establish care. Caitlin Pitts was born in [place], [place]. Her family moved to [place] when she was very young. During her high school years, they relocated to [place]. 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 She is single and has no children. She denies tobacco use. She occasionally drinks alcohol. She does vape nicotine daily. ? ?Caitlin Pitts has a history of anxiety and depression she is managed on bupropion XL150 mg  and escitalopram 1 mg daily. She feels this has been in good control. ? ?Caitlin Pitts has a history of psoriasis. She is managed by dermatology. She uses Dovonox (calcipotriene) cream.  She is going to be starting Stelara (ustekinumab). She was previously managed well on methotrexate, but developed some liver complications. She was tried on both Skyrizi (risankizumab) and Tremfya (guselkumab), both both were ineffective for her. ? ?Caitlin Pitts has a history of situational anxiety on long plane flights. She is usually prescribed a small quantity of Valium for such flights. She has an upcoming trip to [place] in July. ? ?Past Medical History: ?Patient Active Problem List  ? Diagnosis Date Noted  ? De Quervain's tenosynovitis, right 10/20/2021  ? Posterior tibial tendinitis of left lower extremity 03/22/2020  ? Class 2 severe obesity due to excess calories with serious comorbidity and body mass index (BMI) of 37.0 to 37.9 in adult [facility]) 05/12/2019  ? Degenerative tear of medial meniscus 03/20/2019  ?  Elevated lipase 10/28/2015  ? Ganglion of tendon sheath 10/28/2015  ? Major depression 10/28/2015  ? Psoriasis 10/28/2015  ? Pure hypercholesterolemia 10/28/2015  ? ?Past Surgical History:  ?Procedure Laterality Date  ? CHOLECYSTECTOMY, LAPAROSCOPIC    ? EYE SURGERY Bilateral 05/21/2021  ? FOOT SURGERY    ? HAND SURGERY    ? ?Family History  ?Problem Relation Age of Onset  ? Migraines Mother   ? Hypercholesterolemia Mother   ? Cancer Father   ?     Lung  ? Heart disease Maternal Aunt   ? Stroke Maternal Aunt   ? Cancer Maternal Aunt   ?     Colon  ? Cancer Maternal Grandmother   ?     lungcancer  ? Cancer Paternal Grandmother   ?     colon cancer  ? Cancer Paternal Grandfather   ?     Colon  ? ?Outpatient Medications Prior to Visit  ?Medication Sig Dispense Refill  ? calcipotriene (DOVONOX) 0.005 % cream APPLY CREAM TOPICALLY TWICE DAILY    ? diazepam (VALIUM) 2 MG tablet Take 1 tablet prior to flying.    ? STELARA 90 MG/ML SOSY injection Inject into the skin.    ? buPROPion (WELLBUTRIN XL) 150 MG 24 hr tablet Take 1 tablet (150 mg total) by mouth daily. 90 tablet 3  ? escitalopram (LEXAPRO) 10 MG tablet Take 1 tablet (10 mg total) by mouth daily. 90 tablet 3  ? Diclofenac Sodium (PENNSAID) 2 % SOLN Place 1 application onto the skin 2 (  two) times daily. 112 g 3  ? ?No facility-administered medications prior to visit.  ? ?Allergies  ?Allergen Reactions  ? Aspirin Swelling  ? Desvenlafaxine Rash  ? Penicillins Rash  ? Phentermine Rash  ? ?Objective:  ? ?Today's Vitals  ? 11/14/21 1501  ?BP: 114/68  ?Pulse: 75  ?Temp: 97.8 ?F (36.6 ?C)  ?TempSrc: Temporal  ?SpO2: 95%  ?Weight: 234 lb 12.8 oz (106.5 kg)  ?Height: 5\' 4"  (1.626 m)  ? ?Body mass index is 40.3 kg/m?.  ? ?General: Well developed, well nourished. No acute distress. ?Psych: Alert and oriented. Normal mood and affect. ? ?Health Maintenance Due  ?Topic Date Due  ? HIV Screening  Never done  ? Hepatitis C Screening  Never done  ? PAP SMEAR-Modifier  Never done  ?  MAMMOGRAM  Never done  ? COVID-19 Vaccine (4 - Booster for Pfizer series) 08/09/2020  ? Zoster Vaccines- Shingrix (2 of 2) 02/07/2021  ? Pneumonia Vaccine 11+ Years old (1 - PCV) Never done  ? DEXA SCAN  Never done  ? ? ?  11/14/2021  ?  3:00 PM 12/13/2020  ?  9:24 AM 11/02/2019  ?  8:21 AM  ?Depression screen PHQ 2/9  ?Decreased Interest 0 1 1  ?Down, Depressed, Hopeless  1 0  ?PHQ - 2 Score 0 2 1  ?Altered sleeping  1 1  ?Tired, decreased energy  1 3  ?Change in appetite  1 1  ?Feeling bad or failure about yourself   1 0  ?Trouble concentrating  0 0  ?Moving slowly or fidgety/restless  0 0  ?Suicidal thoughts  0 0  ?PHQ-9 Score  6 6  ?Difficult doing work/chores  Not difficult at all   ? ? ?Assessment & Plan:  ? ?1. Recurrent major depressive disorder, in remission (HCC) ?Depression appears resonably managed. I will continue Caitlin Pitts on Lexapro. ? ?- escitalopram (LEXAPRO) 10 MG tablet; Take 1 tablet (10 mg total) by mouth daily.  Dispense: 90 tablet; Refill: 3 ? ?2. Anxiety ?Caitlin Pitts notes the bupropion seems to help with anxiety associated with her Lexapro use. ? ?- buPROPion (WELLBUTRIN XL) 150 MG 24 hr tablet; Take 1 tablet (150 mg total) by mouth daily.  Dispense: 90 tablet; Refill: 3 ? ?3. Psoriasis ?Fairly well managed. Continue to follow with dermatology. ? ? ?Return in about 6 months (around 05/16/2022) for Reassessment.  ? ?05/18/2022, MD ?

## 2021-11-25 ENCOUNTER — Ambulatory Visit: Payer: No Typology Code available for payment source | Admitting: Family Medicine

## 2022-01-01 ENCOUNTER — Ambulatory Visit (INDEPENDENT_AMBULATORY_CARE_PROVIDER_SITE_OTHER): Payer: No Typology Code available for payment source | Admitting: Family Medicine

## 2022-01-01 ENCOUNTER — Telehealth: Payer: Self-pay

## 2022-01-01 VITALS — BP 118/74 | HR 69 | Temp 97.3°F | Ht 64.0 in | Wt 233.4 lb

## 2022-01-01 DIAGNOSIS — Z6841 Body Mass Index (BMI) 40.0 and over, adult: Secondary | ICD-10-CM | POA: Diagnosis not present

## 2022-01-01 DIAGNOSIS — Z23 Encounter for immunization: Secondary | ICD-10-CM | POA: Diagnosis not present

## 2022-01-01 DIAGNOSIS — E782 Mixed hyperlipidemia: Secondary | ICD-10-CM | POA: Diagnosis not present

## 2022-01-01 LAB — COMPREHENSIVE METABOLIC PANEL
ALT: 19 U/L (ref 0–35)
AST: 17 U/L (ref 0–37)
Albumin: 4.1 g/dL (ref 3.5–5.2)
Alkaline Phosphatase: 56 U/L (ref 39–117)
BUN: 15 mg/dL (ref 6–23)
CO2: 27 mEq/L (ref 19–32)
Calcium: 9.3 mg/dL (ref 8.4–10.5)
Chloride: 107 mEq/L (ref 96–112)
Creatinine, Ser: 0.94 mg/dL (ref 0.40–1.20)
GFR: 63.55 mL/min (ref >60.00)
Glucose, Bld: 115 mg/dL — ABNORMAL HIGH (ref 70–99)
Potassium: 4.5 mEq/L (ref 3.5–5.1)
Sodium: 139 mEq/L (ref 135–145)
Total Bilirubin: 0.5 mg/dL (ref 0.2–1.2)
Total Protein: 7.1 g/dL (ref 6.0–8.3)

## 2022-01-01 LAB — LIPID PANEL
Cholesterol: 198 mg/dL (ref 0–200)
HDL: 54.3 mg/dL (ref >39.00)
LDL Cholesterol: 111 mg/dL — ABNORMAL HIGH (ref 0–99)
NonHDL: 143.39
Total CHOL/HDL Ratio: 4
Triglycerides: 163 mg/dL — ABNORMAL HIGH (ref 0.0–149.0)
VLDL: 32.6 mg/dL (ref 0.0–40.0)

## 2022-01-01 LAB — HEMOGLOBIN A1C: Hgb A1c MFr Bld: 5.6 % (ref 4.6–6.5)

## 2022-01-01 MED ORDER — WEGOVY 0.5 MG/0.5ML ~~LOC~~ SOAJ: 0.5000 mg | SUBCUTANEOUS | 0 refills | Status: DC

## 2022-01-01 MED ORDER — WEGOVY 0.25 MG/0.5ML ~~LOC~~ SOAJ: 0.2500 mg | SUBCUTANEOUS | 0 refills | Status: DC

## 2022-01-01 NOTE — Telephone Encounter (Signed)
PA for Wegovy 0.25 & 0.5 ml submitted thru Rx benefits portal   PA ID #'s [id number] & [id number].   Will check tomorrow decision.   Faxed last OV note to [phone]. Dm/cma

## 2022-01-01 NOTE — Progress Notes (Signed)
[facility] [address] [place] [address] Dept: [phone] Dept Fax: [phone]  Office Visit  Subjective:    Patient ID: Caitlin Pitts, female    DOB: [birth year], 66 y.o..   MRN: [id number]  Chief Complaint  Patient presents with   Follow-up    F/u weight and discuss starting [facility].      History of Present Illness:  Patient is in today for a discussion about medical management of her obesity. Ms. Millikan notes she has struggled with her weight most of her life. She states she "hate[s] to exercise", though she is taking her dog for 10 min. walks twice a day. She admits that this is at a lighter intensity. She has been doing intermittent fasting, but notes during the 7 hours she eats each day, she still feels like she overeats. She was on a medication some years ago that caused her pulse to race and made her feel jittery (possibly phentermine). She also was managed on Contrave for a time, but had significant nausea issues with this. MS. Fruchter notes her sister had started on liraglutide Greggory Keen) and is now on semaglutide [facility]) and has lost 40 lbs. Ms. Hajduk has a history of hyperlipidemia.  Past Medical History: Patient Active Problem List   Diagnosis Date Noted   De Quervain's tenosynovitis, right 10/20/2021   Posterior tibial tendinitis of left lower extremity 03/22/2020   Class 3 severe obesity due to excess calories with body mass index (BMI) of 40.0 to 44.9 in adult (HCC) 05/12/2019   Degenerative tear of medial meniscus 03/20/2019   Elevated lipase 10/28/2015   Ganglion of tendon sheath 10/28/2015   Major depression 10/28/2015   Psoriasis 10/28/2015   Hyperlipidemia 10/28/2015   Past Surgical History:  Procedure Laterality Date   CHOLECYSTECTOMY, LAPAROSCOPIC     EYE SURGERY Bilateral 05/21/2021   FOOT SURGERY     HAND SURGERY     Family History  Problem Relation Age of Onset   Migraines Mother     Hypercholesterolemia Mother    Cancer Father        Lung   Heart disease Maternal Aunt    Stroke Maternal Aunt    Cancer Maternal Aunt        Colon   Cancer Maternal Grandmother        lungcancer   Cancer Paternal Grandmother        colon cancer   Cancer Paternal Grandfather        Colon   Outpatient Medications Prior to Visit  Medication Sig Dispense Refill   buPROPion (WELLBUTRIN XL) 150 MG 24 hr tablet Take 1 tablet (150 mg total) by mouth daily. 90 tablet 3   calcipotriene (DOVONOX) 0.005 % cream APPLY CREAM TOPICALLY TWICE DAILY     diazepam (VALIUM) 2 MG tablet Take 1 tablet prior to flying.     escitalopram (LEXAPRO) 10 MG tablet Take 1 tablet (10 mg total) by mouth daily. 90 tablet 3   STELARA 90 MG/ML SOSY injection Inject into the skin.     prednisoLONE acetate (PRED FORTE) 1 % ophthalmic suspension SMARTSIG:In Eye(s)     No facility-administered medications prior to visit.   Allergies  Allergen Reactions   Aspirin Swelling   Desvenlafaxine Rash   Penicillins Rash   Phentermine Rash    Objective:   Today's Vitals   01/01/22 0756  BP: 118/74  Pulse: 69  Temp: (!) 97.3 F (36.3 C)  TempSrc: Temporal  SpO2: 96%  Weight: 233 lb 6.4 oz (105.9 kg)  Height: 5\' 4"  (1.626 m)   Body mass index is 40.06 kg/m.   General: Well developed, well nourished. No acute distress. Psych: Alert and oriented. Normal mood and affect.  Health Maintenance Due  Topic Date Due   HIV Screening  Never done   Hepatitis C Screening  Never done   PAP SMEAR-Modifier  Never done   MAMMOGRAM  Never done   Zoster Vaccines- Shingrix (2 of 2) 02/07/2021   Pneumonia Vaccine 52+ Years old (1 - PCV) Never done   DEXA SCAN  Never done     Assessment & Plan:   1. Class 3 severe obesity due to excess calories with serious comorbidity and body mass index (BMI) of 40.0 to 44.9 in adult [facility]) Ms. Hefel has had an increase in her weight over the past 2 years, moving from Class 2 to Class 3  obesity. We discussed the important role of dietary changes and regular exercise in her weight loss and weight maintenance once she gets to her target weight. I recommended that she pick up her pace of walking to moderate-intensity with a goal of 30 min of exercise 5 days a week. I will check screenign labs for other co-morbidities of her obesity. I will start Wegovy and plan to see her back in 2 months.  - Semaglutide-Weight Management (WEGOVY) 0.25 MG/0.5ML SOAJ; Inject 0.25 mg into the skin once a week.  Dispense: 2 mL; Refill: 0 - Semaglutide-Weight Management (WEGOVY) 0.5 MG/0.5ML SOAJ; Inject 0.5 mg into the skin once a week. To began after completing 4 weeks of the 0.25 mg dose.  Dispense: 2 mL; Refill: 0 - Comprehensive metabolic panel - Hemoglobin A1c  2. Mixed hyperlipidemia Due for reassessment of lipids.  - Lipid panel   Return in about 2 months (around 03/03/2022).   03/05/2022, MD

## 2022-01-05 NOTE — Telephone Encounter (Signed)
PA for [facility] approved and pharmacy notified VIA phone. Dm/cma

## 2022-01-20 ENCOUNTER — Telehealth: Payer: Self-pay | Admitting: Family Medicine

## 2022-01-20 NOTE — Telephone Encounter (Signed)
Gregary Signs from [facility] said the pt needs her wegovy refilled you can call her at [phone] case # [id number]

## 2022-01-20 NOTE — Telephone Encounter (Signed)
Spoke to [facility] @ cover my meds,  advised that RX was approved there [email or web address] on 02/05/22. No further questions. Dm/cma

## 2022-01-27 ENCOUNTER — Other Ambulatory Visit: Payer: Self-pay | Admitting: Family Medicine

## 2022-02-05 ENCOUNTER — Encounter: Payer: Self-pay | Admitting: Family Medicine

## 2022-02-12 ENCOUNTER — Ambulatory Visit: Payer: Self-pay

## 2022-02-12 ENCOUNTER — Ambulatory Visit: Payer: No Typology Code available for payment source | Admitting: Family Medicine

## 2022-02-12 ENCOUNTER — Encounter: Payer: Self-pay | Admitting: Family Medicine

## 2022-02-12 VITALS — BP 120/82 | Ht 64.0 in | Wt 233.0 lb

## 2022-02-12 DIAGNOSIS — M654 Radial styloid tenosynovitis [de Quervain]: Secondary | ICD-10-CM | POA: Diagnosis not present

## 2022-02-12 MED ORDER — BETAMETHASONE SOD PHOS & ACET 6 (3-3) MG/ML IJ SUSP
6.0000 mg | Freq: Once | INTRAMUSCULAR | Status: AC
  Administered 2022-02-12: 6 mg via INTRA_ARTICULAR

## 2022-02-12 NOTE — Progress Notes (Signed)
  Caitlin Pitts - 66 y.o. female MRN [id number]  Date of birth: 1956/04/11  SUBJECTIVE:  Including CC & ROS.  No chief complaint on file.   Caitlin Pitts is a 66 y.o. female that is presenting with acute right wrist pain.  This is similar to her previous pain.  She did well with the previous injection.  Pain occurring at the radial styloid.  Worse with gripping.   Review of Systems See HPI   HISTORY: Past Medical, Surgical, Social, and Family History Reviewed & Updated per EMR.   Pertinent Historical Findings include:  Past Medical History:  Diagnosis Date   Esophageal reflux 10/28/2015   Major depression 10/28/2015   Psoriasis 10/28/2015   Pure hypercholesterolemia 10/28/2015    Past Surgical History:  Procedure Laterality Date   CHOLECYSTECTOMY, LAPAROSCOPIC     EYE SURGERY Bilateral 05/21/2021   FOOT SURGERY     HAND SURGERY       PHYSICAL EXAM:  VS: BP 120/82 (BP Location: Left Arm, Patient Position: Sitting)   Ht 5\' 4"  (1.626 m)   Wt 233 lb (105.7 kg)   BMI 39.99 kg/m  Physical Exam Gen: NAD, alert, cooperative with exam, well-appearing MSK:  Neurovascularly intact    Limited ultrasound: Right wrist:  Effusion on the first dorsal compartment with surrounding hyperemia. No changes within the carpal joints  Summary: Findings consistent with de Quervain's encephalitis.  Ultrasound and interpretation by , MD   Aspiration/Injection Procedure Note Caitlin Pitts 03-Aug-1956  Procedure: Injection Indications: Right wrist pain  Procedure Details Consent: Risks of procedure as well as the alternatives and risks of each were explained to the (patient/caregiver).  Consent for procedure obtained. Time Out: Verified patient identification, verified procedure, site/side was marked, verified correct patient position, special equipment/implants available, medications/allergies/relevent history reviewed, required imaging and test results available.  Performed.   The area was cleaned with iodine and alcohol swabs.    The right first dorsal compartment was injected using 1.5 cc of 1% lidocaine on a 25-gauge 1-1/2 inch needle.  The syringe was switched to mixture containing 1 cc's of 6 mg betamethasone and 1 cc's of 0.25% bupivacaine was injected.  Ultrasound was used. Images were obtained in long views showing the injection.     A sterile dressing was applied.  Patient did tolerate procedure well.    ASSESSMENT & PLAN:   De Quervain's tenosynovitis, right Acute on chronic in nature.  Having hyperemia at the first dorsal compartment. -Counseled on home exercise therapy and supportive care. -Injection today. -Could consider physical therapy or further imaging.

## 2022-02-12 NOTE — Addendum Note (Signed)
Addended by: Merrilyn Puma on: 02/12/2022 04:29 PM   Modules accepted: Orders

## 2022-02-12 NOTE — Patient Instructions (Signed)
Good to see you Please use ice as needed   Please send me a message in MyChart with any questions or updates.  Please see me back in 4 weeks or as needed if better.   --Dr. Garan Frappier  

## 2022-02-12 NOTE — Assessment & Plan Note (Signed)
Acute on chronic in nature.  Having hyperemia at the first dorsal compartment. -Counseled on home exercise therapy and supportive care. -Injection today. -Could consider physical therapy or further imaging.

## 2022-02-18 ENCOUNTER — Ambulatory Visit: Payer: No Typology Code available for payment source | Admitting: Family Medicine

## 2022-02-27 ENCOUNTER — Encounter: Payer: Self-pay | Admitting: Family Medicine

## 2022-02-27 ENCOUNTER — Ambulatory Visit: Payer: No Typology Code available for payment source | Admitting: Family Medicine

## 2022-02-27 VITALS — BP 116/70 | HR 80 | Temp 97.5°F | Ht 64.0 in | Wt 221.4 lb

## 2022-02-27 DIAGNOSIS — Z6841 Body Mass Index (BMI) 40.0 and over, adult: Secondary | ICD-10-CM

## 2022-02-27 DIAGNOSIS — F40243 Fear of flying: Secondary | ICD-10-CM | POA: Insufficient documentation

## 2022-02-27 MED ORDER — WEGOVY 1.7 MG/0.75ML ~~LOC~~ SOAJ: 1.7000 mg | SUBCUTANEOUS | 0 refills | Status: DC

## 2022-02-27 MED ORDER — DIAZEPAM 2 MG PO TABS: ORAL_TABLET | ORAL | 0 refills | Status: AC

## 2022-02-27 MED ORDER — WEGOVY 1 MG/0.5ML ~~LOC~~ SOAJ: 1.0000 mg | SUBCUTANEOUS | 0 refills | Status: DC

## 2022-02-27 NOTE — Progress Notes (Signed)
[facility] [address] [place] [address] Dept: [phone] Dept Fax: [phone]  Office Visit  Subjective:    Patient ID: Caitlin Pitts, female    DOB: Dec 08, 1955, 66 y.o..   MRN: [id number]  Chief Complaint  Patient presents with   Follow-up    2 month f/u.  No concerns.       History of Present Illness:  Patient is in today for ongoing medical management of weight loss. Ms. Brickner had difficulty obtaining the 0.5 mg dose of Wegovy, so had a 3-week gap in therapy. She is pleased with her weight loss so far. She is finding this helps with reducing her intake. She notes that avoiding junk food and hydrating well has helped cut down on medication side effects.  MS. Marzano has a history of anxiety around flying. She is leaving in 2 weeks for a flight to [place] for a [place] river cruise. She has taken Valium in the past before flights and requests a refill of this.  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 03/22/2020   Class 3 severe obesity due to excess calories with body mass index (BMI) of 40.0 to 44.9 in adult (HCC) 05/12/2019   Degenerative tear of medial meniscus 03/20/2019   Elevated lipase 10/28/2015   Ganglion of tendon sheath 10/28/2015   Major depression 10/28/2015   Psoriasis 10/28/2015   Hyperlipidemia 10/28/2015   Past Surgical History:  Procedure Laterality Date   CHOLECYSTECTOMY, LAPAROSCOPIC     EYE SURGERY Bilateral 05/21/2021   FOOT SURGERY     HAND SURGERY     Family History  Problem Relation Age of Onset   Migraines Mother    Hypercholesterolemia Mother    Cancer Father        Lung   Heart disease Maternal Aunt    Stroke Maternal Aunt    Cancer Maternal Aunt        Colon   Cancer Maternal Grandmother        lungcancer    Cancer Paternal Grandmother        colon cancer   Cancer Paternal Grandfather        Colon   Outpatient Medications Prior to Visit  Medication Sig Dispense Refill   buPROPion (WELLBUTRIN XL) 150 MG 24 hr tablet Take 1 tablet (150 mg total) by mouth daily. 90 tablet 3   calcipotriene (DOVONOX) 0.005 % cream APPLY CREAM TOPICALLY TWICE DAILY     escitalopram (LEXAPRO) 10 MG tablet Take 1 tablet (10 mg total) by mouth daily. 90 tablet 3   Semaglutide-Weight Management (WEGOVY) 0.5 MG/0.5ML SOAJ Inject 0.5 mg into the skin once a week. To began after completing 4 weeks of the 0.25 mg dose. 2 mL 0   STELARA 90 MG/ML SOSY injection Inject into the skin.     diazepam (VALIUM) 2 MG tablet Take 1 tablet prior to flying.     Semaglutide-Weight Management (WEGOVY) 0.25 MG/0.5ML SOAJ Inject 0.25 mg into the skin once a week. 2 mL 0   No facility-administered medications prior to visit.   Allergies  Allergen Reactions   Aspirin Swelling   Desvenlafaxine Rash   Penicillins Rash   Phentermine Rash     Objective:   Today's Vitals   02/27/22 0758  BP: 116/70  Pulse: 80  Temp: Marland Kitchen)  97.5 F (36.4 C)  TempSrc: Temporal  SpO2: 97%  Weight: 221 lb 6.4 oz (100.4 kg)  Height: 5\' 4"  (1.626 m)   Body mass index is 38 kg/m.   General: Well developed, well nourished. No acute distress. Psych: Alert and oriented. Normal mood and affect.  Health Maintenance Due  Topic Date Due   HIV Screening  Never done   Hepatitis C Screening  Never done   PAP SMEAR-Modifier  Never done   MAMMOGRAM  Never done   COVID-19 Vaccine (4 - Pfizer series) 08/09/2020   Zoster Vaccines- Shingrix (2 of 2) 02/07/2021   DEXA SCAN  Never done     Assessment & Plan:   1. Class 3 severe obesity due to excess calories with serious comorbidity and body mass index (BMI) of 40.0 to 44.9 in adult (HCC) Starting weight: 233 lb. Current weight loss: 12 lb (5.1%)  We will step up sequentially tot he 1 mg dose for a month,  then 1.7 mg. I will see her back in 2 months to continue to monitor her weight loss. I reinforced the importance of exercise to help her achieve the best results with weight loss and maintain once she stops the medication.  - Semaglutide-Weight Management (WEGOVY) 1 MG/0.5ML SOAJ; Inject 1 mg into the skin once a week. To start after 28 days on the 0.5 mg dose.  Dispense: 2 mL; Refill: 0 - Semaglutide-Weight Management (WEGOVY) 1.7 MG/0.75ML SOAJ; Inject 1.7 mg into the skin once a week. To start after 28 days on the 1 mg dose.  Dispense: 3 mL; Refill: 0  2. Fear of flying I will prescribe a short supply of Valium.  - diazepam (VALIUM) 2 MG tablet; Take 1 tablet prior to flying.  Dispense: 10 tablet; Refill: 0   Return in about 2 months (around 04/30/2022) for Reassessment.   05/02/2022, MD

## 2022-03-03 ENCOUNTER — Ambulatory Visit: Payer: No Typology Code available for payment source | Admitting: Family Medicine

## 2022-04-29 ENCOUNTER — Encounter: Payer: Self-pay | Admitting: Family Medicine

## 2022-04-29 ENCOUNTER — Ambulatory Visit: Payer: No Typology Code available for payment source | Admitting: Family Medicine

## 2022-04-29 VITALS — BP 118/72 | HR 72 | Temp 97.0°F | Ht 64.0 in | Wt 216.6 lb

## 2022-04-29 DIAGNOSIS — Z6837 Body mass index (BMI) 37.0-37.9, adult: Secondary | ICD-10-CM

## 2022-04-29 DIAGNOSIS — Z23 Encounter for immunization: Secondary | ICD-10-CM

## 2022-04-29 MED ORDER — WEGOVY 2.4 MG/0.75ML ~~LOC~~ SOAJ: 2.4000 mg | SUBCUTANEOUS | 5 refills | Status: DC

## 2022-04-29 NOTE — Progress Notes (Signed)
[facility] [address] [place] [address] Dept: [phone] Dept Fax: [phone]  Office Visit  Subjective:    Patient ID: Caitlin Pitts, female    DOB: 10-06-1955, 66 y.o..   MRN: [id number]  Chief Complaint  Patient presents with   Follow-up    2 month f/u.  No concerns.   Fasting today.  Shingles shot today.     History of Present Illness:  Patient is in today for follow-up regarding medication management of obesity. She is currently on Wegovy 1.7 mg per week. She is tolerating the dose well. She did some travel to [place] over the past month. She had hoped to see more weight loss at this point, but is satisfied with overall. She is getting out to walk for 20 min. each day. She notes her clothes are much looser now.  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 03/22/2020   Class 2 obesity due to excess calories with body mass index (BMI) of 37.0 to 37.9 in adult 05/12/2019   Degenerative tear of medial meniscus 03/20/2019   Elevated lipase 10/28/2015   Ganglion of tendon sheath 10/28/2015   Major depression 10/28/2015   Psoriasis 10/28/2015   Hyperlipidemia 10/28/2015   Past Surgical History:  Procedure Laterality Date   CHOLECYSTECTOMY, LAPAROSCOPIC     EYE SURGERY Bilateral 05/21/2021   FOOT SURGERY     HAND SURGERY     Family History  Problem Relation Age of Onset   Migraines Mother    Hypercholesterolemia Mother    Cancer Father        Lung   Heart disease Maternal Aunt    Stroke Maternal Aunt    Cancer Maternal Aunt        Colon   Cancer Maternal Grandmother        lungcancer   Cancer Paternal Grandmother        colon cancer   Cancer Paternal Grandfather        Colon   Outpatient Medications Prior to Visit  Medication Sig Dispense  Refill   buPROPion (WELLBUTRIN XL) 150 MG 24 hr tablet Take 1 tablet (150 mg total) by mouth daily. 90 tablet 3   calcipotriene (DOVONOX) 0.005 % cream APPLY CREAM TOPICALLY TWICE DAILY     diazepam (VALIUM) 2 MG tablet Take 1 tablet prior to flying. 10 tablet 0   escitalopram (LEXAPRO) 10 MG tablet Take 1 tablet (10 mg total) by mouth daily. 90 tablet 3   Semaglutide-Weight Management (WEGOVY) 1.7 MG/0.75ML SOAJ Inject 1.7 mg into the skin once a week. To start after 28 days on the 1 mg dose. 3 mL 0   STELARA 90 MG/ML SOSY injection Inject into the skin.     Semaglutide-Weight Management (WEGOVY) 0.5 MG/0.5ML SOAJ Inject 0.5 mg into the skin once a week. To began after completing 4 weeks of the 0.25 mg dose. 2 mL 0   Semaglutide-Weight Management (WEGOVY) 1 MG/0.5ML SOAJ Inject 1 mg into the skin once a week. To start after 28 days on the 0.5 mg dose. 2 mL 0   No facility-administered medications prior to visit.   Allergies  Allergen Reactions   Aspirin Swelling   Desvenlafaxine Rash   Penicillins Rash   Phentermine Rash     Objective:   Today's Vitals  04/29/22 0801  BP: 118/72  Pulse: 72  Temp: (!) 97 F (36.1 C)  TempSrc: Temporal  SpO2: 96%  Weight: 216 lb 9.6 oz (98.2 kg)  Height: 5\' 4"  (1.626 m)   Body mass index is 37.18 kg/m.   General: Well developed, well nourished. No acute distress. Psych: Alert and oriented. Normal mood and affect.  Health Maintenance Due  Topic Date Due   Hepatitis C Screening  Never done   MAMMOGRAM  Never done   Zoster Vaccines- Shingrix (2 of 2) 02/07/2021   DEXA SCAN  Never done   INFLUENZA VACCINE  03/17/2022      Assessment & Plan:   1. Class 2 severe obesity due to excess calories with serious comorbidity and body mass index (BMI) of 37.0 to 37.9 in adult (HCC) Maximum weight: 234 lbs (11/14/2021) Current weight: 216 lbs Weight change since last visit: - 5 lb.  We will go ahead and step the Wegovy dose up to 2.4 mg weekly.  Encourage ongoing exercise efforts.  - Semaglutide-Weight Management (WEGOVY) 2.4 MG/0.75ML SOAJ; Inject 2.4 mg into the skin once a week. To begin after completing 4 weeks at the 1.7 mg weekly dose.  Dispense: 3 mL; Refill: 5   Return in about 2 months (around 06/29/2022) for Reassessment.   07/01/2022, MD

## 2022-04-29 NOTE — Addendum Note (Signed)
Addended by: Waymond Cera on: 04/29/2022 08:29 AM   Modules accepted: Orders

## 2022-05-07 ENCOUNTER — Telehealth: Payer: Self-pay

## 2022-05-07 ENCOUNTER — Other Ambulatory Visit: Payer: Self-pay

## 2022-05-07 NOTE — Telephone Encounter (Signed)
Calling pt to assist with mammo scheduling. LVM for pt to return phone call to the office.

## 2022-06-09 ENCOUNTER — Ambulatory Visit: Payer: No Typology Code available for payment source | Admitting: Family Medicine

## 2022-06-09 ENCOUNTER — Encounter: Payer: Self-pay | Admitting: Family Medicine

## 2022-06-09 ENCOUNTER — Ambulatory Visit: Payer: Self-pay

## 2022-06-09 ENCOUNTER — Ambulatory Visit (HOSPITAL_BASED_OUTPATIENT_CLINIC_OR_DEPARTMENT_OTHER)
Admission: RE | Admit: 2022-06-09 | Discharge: 2022-06-09 | Disposition: A | Discharge status: Home / Self Care | Payer: No Typology Code available for payment source | Source: Ambulatory Visit | Attending: Family Medicine | Admitting: Family Medicine

## 2022-06-09 VITALS — BP 110/74 | Ht 64.0 in | Wt 216.0 lb

## 2022-06-09 DIAGNOSIS — M654 Radial styloid tenosynovitis [de Quervain]: Secondary | ICD-10-CM

## 2022-06-09 MED ORDER — BETAMETHASONE SOD PHOS & ACET 6 (3-3) MG/ML IJ SUSP
6.0000 mg | Freq: Once | INTRAMUSCULAR | Status: AC
  Administered 2022-06-09: 6 mg via INTRA_ARTICULAR

## 2022-06-09 NOTE — Progress Notes (Signed)
  YASAMAN KOLEK - 66 y.o. female MRN [id number]  Date of birth: 12-24-1955  SUBJECTIVE:  Including CC & ROS.  No chief complaint on file.   SARAJANE FAMBROUGH is a 66 y.o. female that is presenting with acute on chronic right wrist pain.  She did well with the previous injection until recently.  Having similar pain.  No injury inciting event.  Pain is occurring at the radial aspect of the wrist.    Review of Systems See HPI   HISTORY: Past Medical, Surgical, Social, and Family History Reviewed & Updated per EMR.   Pertinent Historical Findings include:  Past Medical History:  Diagnosis Date   Esophageal reflux 10/28/2015   Major depression 10/28/2015   Psoriasis 10/28/2015   Pure hypercholesterolemia 10/28/2015    Past Surgical History:  Procedure Laterality Date   CHOLECYSTECTOMY, LAPAROSCOPIC     EYE SURGERY Bilateral 05/21/2021   FOOT SURGERY     HAND SURGERY       PHYSICAL EXAM:  VS: BP 110/74 (BP Location: Left Arm, Patient Position: Sitting)   Ht 5\' 4"  (1.626 m)   Wt 216 lb (98 kg)   BMI 37.08 kg/m  Physical Exam Gen: NAD, alert, cooperative with exam, well-appearing MSK:  Neurovascularly intact    Limited ultrasound: Right wrist:  Swelling and effusion appreciated of the first dorsal compartment  Summary: Findings consistent with de Quervain's tenosynovitis  Ultrasound and interpretation by Clearance Coots, MD  Aspiration/Injection Procedure Note Lorre Nick [birth year]  Procedure: Injection Indications: Right wrist pain  Procedure Details Consent: Risks of procedure as well as the alternatives and risks of each were explained to the (patient/caregiver).  Consent for procedure obtained. Time Out: Verified patient identification, verified procedure, site/side was marked, verified correct patient position, special equipment/implants available, medications/allergies/relevent history reviewed, required imaging and test results available.  Performed.  The area was  cleaned with iodine and alcohol swabs.    The right first dorsal compartment was injected using half a cc of 1% lidocaine on a 25-gauge 1-1/2 inch needle.  The syringe was switched and a mixture containing 1 cc's of 6 mg betamethasone and 1 cc's of 0.25% bupivacaine was injected.  Ultrasound was used. Images were obtained in long views showing the injection.     A sterile dressing was applied.  Patient did tolerate procedure well.     ASSESSMENT & PLAN:   De Quervain's tenosynovitis, right Acute on chronic in nature.  Effusion appreciated within the tendon sheath again today. -Counseled on home exercise therapy and supportive care. -X-ray  -Injection today. -Could consider physical therapy or further imaging.

## 2022-06-09 NOTE — Assessment & Plan Note (Signed)
Acute on chronic in nature.  Effusion appreciated within the tendon sheath again today. -Counseled on home exercise therapy and supportive care. -X-ray  -Injection today. -Could consider physical therapy or further imaging.

## 2022-06-09 NOTE — Patient Instructions (Signed)
Good to see you You can try heat  Please try the exercises  We will call with the xray results.  Please let me know if you want to try physical therapy or further imaging   Please send me a message in MyChart with any questions or updates.  Please see me back in 4 weeks or as needed if better.   --Dr. Raeford Razor

## 2022-06-09 NOTE — Addendum Note (Signed)
Addended by: Cresenciano Lick on: 06/09/2022 03:09 PM   Modules accepted: Orders

## 2022-06-16 ENCOUNTER — Encounter: Payer: Self-pay | Admitting: Family Medicine

## 2022-06-26 ENCOUNTER — Other Ambulatory Visit: Payer: Self-pay | Admitting: Family Medicine

## 2022-06-26 DIAGNOSIS — M654 Radial styloid tenosynovitis [de Quervain]: Secondary | ICD-10-CM

## 2022-06-26 NOTE — Progress Notes (Signed)
Pain is ongoing.  Has had pain since June.  Imaging has been unrevealing.  She has tried injections.  She has been under greater than 6 weeks of physician directed home exercise therapy.  We will pursue MRI to evaluate for arthritis and tenosynovitis and for presurgical planning.  Myra Rude, MD [facility] 06/26/2022, 12:55 PM

## 2022-06-29 NOTE — Telephone Encounter (Signed)
Sorry, meant MRI of wrist - you guys caught that.  She's asking if the MRI order was faxed to [facility] at [phone].

## 2022-07-07 ENCOUNTER — Ambulatory Visit: Payer: No Typology Code available for payment source | Admitting: Family Medicine

## 2022-07-19 ENCOUNTER — Ambulatory Visit
Admission: RE | Admit: 2022-07-19 | Discharge: 2022-07-19 | Disposition: A | Discharge status: Home / Self Care | Payer: No Typology Code available for payment source | Source: Ambulatory Visit | Attending: Family Medicine | Admitting: Family Medicine

## 2022-07-19 DIAGNOSIS — M654 Radial styloid tenosynovitis [de Quervain]: Secondary | ICD-10-CM

## 2022-07-21 ENCOUNTER — Telehealth (INDEPENDENT_AMBULATORY_CARE_PROVIDER_SITE_OTHER): Payer: No Typology Code available for payment source | Admitting: Family Medicine

## 2022-07-21 ENCOUNTER — Encounter: Payer: Self-pay | Admitting: Family Medicine

## 2022-07-21 VITALS — Ht 64.0 in | Wt 216.0 lb

## 2022-07-21 DIAGNOSIS — M654 Radial styloid tenosynovitis [de Quervain]: Secondary | ICD-10-CM

## 2022-07-21 NOTE — Progress Notes (Signed)
Virtual Visit via Video Note  I connected with Caitlin Pitts on 07/21/22 at 10:30 AM EST by a video enabled telemedicine application and verified that I am speaking with the correct person using two identifiers.  Location: Patient: work Provider: office   I discussed the limitations of evaluation and management by telemedicine and the availability of in person appointments. The patient expressed understanding and agreed to proceed.  History of Present Illness:  Caitlin Pitts is a 66 year old female is following up after the MRI of her right hand.  This was demonstrating moderate de Quervain tenosynovitis.    Observations/Objective:   Assessment and Plan:  Right hand de Quervain's tenosynovitis: Acute on chronic in nature.  She has been dealing with this since March.  We have tried splinting, injections and medications.  Her symptoms been refractory to this point. -Counseled on home exercise therapy and supportive care.   Follow Up Instructions:    I discussed the assessment and treatment plan with the patient. The patient was provided an opportunity to ask questions and all were answered. The patient agreed with the plan and demonstrated an understanding of the instructions.   The patient was advised to call back or seek an in-person evaluation if the symptoms worsen or if the condition fails to improve as anticipated.    [name], MD

## 2022-07-21 NOTE — Assessment & Plan Note (Signed)
Right hand de Quervain's tenosynovitis: Acute on chronic in nature.  She has been dealing with this since March.  We have tried splinting, injections and medications.  Her symptoms been refractory to this point. -Counseled on home exercise therapy and supportive care.

## 2022-07-22 ENCOUNTER — Other Ambulatory Visit: Payer: Self-pay | Admitting: Family Medicine

## 2022-07-22 ENCOUNTER — Encounter: Payer: Self-pay | Admitting: Family Medicine

## 2022-07-22 DIAGNOSIS — M654 Radial styloid tenosynovitis [de Quervain]: Secondary | ICD-10-CM

## 2022-08-18 ENCOUNTER — Encounter: Payer: Self-pay | Admitting: Family Medicine

## 2022-08-18 ENCOUNTER — Ambulatory Visit: Payer: No Typology Code available for payment source | Admitting: Family Medicine

## 2022-08-18 VITALS — BP 124/76 | HR 74 | Temp 97.1°F | Ht 64.0 in | Wt 205.6 lb

## 2022-08-18 DIAGNOSIS — Z6835 Body mass index (BMI) 35.0-35.9, adult: Secondary | ICD-10-CM | POA: Diagnosis not present

## 2022-08-18 DIAGNOSIS — F3340 Major depressive disorder, recurrent, in remission, unspecified: Secondary | ICD-10-CM

## 2022-08-18 DIAGNOSIS — E6609 Other obesity due to excess calories: Secondary | ICD-10-CM

## 2022-08-18 DIAGNOSIS — M654 Radial styloid tenosynovitis [de Quervain]: Secondary | ICD-10-CM

## 2022-08-18 MED ORDER — ESCITALOPRAM OXALATE 10 MG PO TABS: 10.0000 mg | ORAL_TABLET | Freq: Every day | ORAL | 3 refills | Status: DC

## 2022-08-18 MED ORDER — BUPROPION HCL ER (XL) 150 MG PO TB24: 150.0000 mg | ORAL_TABLET | Freq: Every day | ORAL | 3 refills | Status: DC

## 2022-08-18 MED ORDER — ZEPBOUND 10 MG/0.5ML ~~LOC~~ SOAJ: 10.0000 mg | SUBCUTANEOUS | 0 refills | Status: DC

## 2022-08-18 MED ORDER — ZEPBOUND 12.5 MG/0.5ML ~~LOC~~ SOAJ: 12.5000 mg | SUBCUTANEOUS | 0 refills | Status: DC

## 2022-08-18 NOTE — Progress Notes (Signed)
[facility] [address] [place] [address] Dept: [phone] Dept Fax: [phone]  Chronic Care Office Visit  Subjective:    Patient ID: Caitlin Pitts, female    DOB: 1955/12/25, 67 y.o..   MRN: [id number]  Chief Complaint  Patient presents with   Follow-up    2 month f/u.  Fasting today. No concerns.      History of Present Illness:  Patient is in today for reassessment of chronic medical issues.  Caitlin Pitts has a history of obesity. She is on medication management with Wegovy 2.4 mg per week. She is tolerating the dose well. She has found that injecting in her thigh has reduced the nausea she has experienced. She requests to be switched to Zepbound (tirzepatide). She feels it leads to more rapid weight loss and potentially has the advantage of impacting two different receptors (GLP-1 and GIP).  Caitlin Pitts has a history of depression. She is managed on bupropion XL150 mg and escitalopram 10 mg daily. She notes dealing with her 27 year-old mother is a major stressor for her. Overall he mood is doing well.  Caitlin Pitts has a history of De Quervain's tenosynovitis. She ahs undergone repeated injections for this, without resolution. She notes she is scheduled for surgery for this soon.  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 03/22/2020   Class 2 obesity due to excess calories with body mass index (BMI) of 37.0 to 37.9 in adult 05/12/2019   Degenerative tear of medial meniscus 03/20/2019   Elevated lipase 10/28/2015   Ganglion of tendon sheath 10/28/2015   Major depression 10/28/2015   Psoriasis 10/28/2015   Hyperlipidemia 10/28/2015   Past Surgical History:  Procedure Laterality Date   CHOLECYSTECTOMY, LAPAROSCOPIC     EYE SURGERY Bilateral 05/21/2021   FOOT  SURGERY     HAND SURGERY     Family History  Problem Relation Age of Onset   Migraines Mother    Hypercholesterolemia Mother    Cancer Father        Lung   Heart disease Maternal Aunt    Stroke Maternal Aunt    Cancer Maternal Aunt        Colon   Cancer Maternal Grandmother        lungcancer   Cancer Paternal Grandmother        colon cancer   Cancer Paternal Grandfather        Colon   Outpatient Medications Prior to Visit  Medication Sig Dispense Refill   calcipotriene (DOVONOX) 0.005 % cream APPLY CREAM TOPICALLY TWICE DAILY     diazepam (VALIUM) 2 MG tablet Take 1 tablet prior to flying. 10 tablet 0   STELARA 90 MG/ML SOSY injection Inject into the skin.     buPROPion (WELLBUTRIN XL) 150 MG 24 hr tablet Take 1 tablet (150 mg total) by mouth daily. 90 tablet 3   escitalopram (LEXAPRO) 10 MG tablet Take 1 tablet (10 mg total) by mouth daily. 90 tablet 3   Semaglutide-Weight Management (WEGOVY) 2.4 MG/0.75ML SOAJ Inject 2.4 mg into the skin once a week. To begin after completing 4 weeks at the 1.7 mg weekly dose. 3 mL 5   Semaglutide-Weight Management (WEGOVY) 1.7 MG/0.75ML SOAJ Inject 1.7 mg into the skin once a week. To start after 28 days on the  1 mg dose. 3 mL 0   No facility-administered medications prior to visit.   Allergies  Allergen Reactions   Aspirin Swelling   Desvenlafaxine Rash   Penicillins Rash   Phentermine Rash    Objective:   Today's Vitals   08/18/22 0807  BP: 124/76  Pulse: 74  Temp: (!) 97.1 F (36.2 C)  TempSrc: Temporal  SpO2: 96%  Weight: 205 lb 9.6 oz (93.3 kg)  Height: 5\' 4"  (1.626 m)   Body mass index is 35.29 kg/m.   General: Well developed, well nourished. No acute distress. Psych: Alert and oriented. Normal mood and affect.  Health Maintenance Due  Topic Date Due   Hepatitis C Screening  Never done   MAMMOGRAM  Never done   DEXA SCAN  Never done      08/18/2022    8:31 AM 04/29/2022    8:12 AM 01/01/2022    8:19 AM   Depression screen PHQ 2/9  Decreased Interest 0 0 0  Down, Depressed, Hopeless 0 0 0  PHQ - 2 Score 0 0 0  Altered sleeping 1 1 1   Tired, decreased energy 0 1 1  Change in appetite 0 0 1  Feeling bad or failure about yourself  0 0 1  Trouble concentrating 0 0 0  Moving slowly or fidgety/restless 0 0 0  Suicidal thoughts 0 0 0  PHQ-9 Score 1 2 4   Difficult doing work/chores Not difficult at all Not difficult at all Not difficult at all      08/18/2022    8:32 AM 03/01/2019    9:35 AM  GAD 7 : Generalized Anxiety Score  Nervous, Anxious, on Edge 1 1  Control/stop worrying 1 1  Worry too much - different things 1 1  Trouble relaxing 0 1  Restless 0 0  Easily annoyed or irritable 0 0  Afraid - awful might happen 1 0  Total GAD 7 Score 4 4  Anxiety Difficulty Not difficult at all      Assessment & Plan:   1. Class 2 obesity due to excess calories without serious comorbidity with body mass index (BMI) of 35.0 to 35.9 in adult Maximum weight: 234  lbs (11/14/2021) Current weight: 205 lbs Weight change since last visit: -11 lbs Total weight loss: 19 lbs (8.1%)  - tirzepatide (ZEPBOUND) 10 MG/0.5ML Pen; Inject 10 mg into the skin once a week.  Dispense: 2 mL; Refill: 0 - tirzepatide (ZEPBOUND) 12.5 MG/0.5ML Pen; Inject 12.5 mg into the skin once a week.  Dispense: 2 mL; Refill: 0  2. Recurrent major depressive disorder, in remission ([place]) Stable. Continue bupropion and escitalopram.  - buPROPion (WELLBUTRIN XL) 150 MG 24 hr tablet; Take 1 tablet (150 mg total) by mouth daily.  Dispense: 90 tablet; Refill: 3 - escitalopram (LEXAPRO) 10 MG tablet; Take 1 tablet (10 mg total) by mouth daily.  Dispense: 90 tablet; Refill: 3  3. De Quervain's tenosynovitis, right Continue use of thumb spica splint. Follow-up with Dr. Apolonio Schneiders concerning surgery.   Return in about 2 months (around 10/17/2022) for Reassessment.   Haydee Salter, MD

## 2022-09-24 ENCOUNTER — Encounter: Payer: Self-pay | Admitting: Family Medicine

## 2022-09-24 ENCOUNTER — Telehealth: Payer: Self-pay

## 2022-09-24 NOTE — Telephone Encounter (Signed)
PA for Zepbound submitted through cover my meds.    ZEPBOUND 10MG /0.5ML PEN INJCTR is not covered for this Member. For formulary alternatives, please view the Member's corresponding formulary at [email or web address] or call us at [phone].  Message sent to patient to check with them to see what is covered.  Dm/cma   KeyWonda Cerise - PA Case ID: [id number]

## 2022-10-20 ENCOUNTER — Ambulatory Visit: Payer: No Typology Code available for payment source | Admitting: Family Medicine

## 2022-10-20 ENCOUNTER — Encounter: Payer: Self-pay | Admitting: Family Medicine

## 2022-10-20 VITALS — BP 112/66 | HR 72 | Temp 97.1°F | Ht 64.0 in | Wt 202.2 lb

## 2022-10-20 DIAGNOSIS — M654 Radial styloid tenosynovitis [de Quervain]: Secondary | ICD-10-CM

## 2022-10-20 DIAGNOSIS — Z6837 Body mass index (BMI) 37.0-37.9, adult: Secondary | ICD-10-CM | POA: Diagnosis not present

## 2022-10-20 NOTE — Assessment & Plan Note (Signed)
Maximum weight: 234  lbs (11/14/2021) Current weight: 202 lbs Weight change since last visit: -3 lbs Total weight loss: 22 lbs (9.4%)  Patient plans to switch to compounded semaglutide. I recommend that she continue to come in every 3 months for monitoring of her weight loss.

## 2022-10-20 NOTE — Progress Notes (Signed)
[facility] [address] [place] [address] Dept: [phone] Dept Fax: [phone]  Chronic Care Office Visit  Subjective:    Patient ID: Caitlin Pitts, female    DOB: Nov 06, 1955, 67 y.o..   MRN: [id number]  Chief Complaint  Patient presents with   Medical Management of Chronic Issues    2 month f/u.     History of Present Illness:  Patient is in today for reassessment of chronic medical issues.  Caitlin Pitts has a history of obesity. She had been on Wegovy 2.4 mg per week. At her last visit, she requested to be switched to Zepbound (tirzepatide). She had established on this, but now her insurance has dropped coverage for weight loss medicines. She notes she has 3 more doses of the tirzepatide. She then plans to switch to a compounded semaglutide through an internet pharmacy.   Caitlin Pitts has a history of De Quervain's tenosynovitis. She had surgery in Jan. for this and notes it is much improved. She feels she is having some allergic reaction to the internal stitches, as she gets episodes of itching around her surgical scar.  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 03/22/2020   Class 2 obesity due to excess calories with body mass index (BMI) of 37.0 to 37.9 in adult 05/12/2019   Degenerative tear of medial meniscus 03/20/2019   Elevated lipase 10/28/2015   Ganglion of tendon sheath 10/28/2015   Major depression 10/28/2015   Psoriasis 10/28/2015   Hyperlipidemia 10/28/2015   Past Surgical History:  Procedure Laterality Date   CHOLECYSTECTOMY, LAPAROSCOPIC     EYE SURGERY Bilateral 05/21/2021   FOOT SURGERY     HAND SURGERY     Family History  Problem Relation Age of Onset   Migraines Mother    Hypercholesterolemia Mother    Cancer Father        Lung    Heart disease Maternal Aunt    Stroke Maternal Aunt    Cancer Maternal Aunt        Colon   Cancer Maternal Grandmother        lungcancer   Cancer Paternal Grandmother        colon cancer   Cancer Paternal Grandfather        Colon   Outpatient Medications Prior to Visit  Medication Sig Dispense Refill   buPROPion (WELLBUTRIN XL) 150 MG 24 hr tablet Take 1 tablet (150 mg total) by mouth daily. 90 tablet 3   calcipotriene (DOVONOX) 0.005 % cream APPLY CREAM TOPICALLY TWICE DAILY     cholecalciferol (VITAMIN D3) 25 MCG (1000 UNIT) tablet Take 5,000 Units by mouth daily.     diazepam (VALIUM) 2 MG tablet Take 1 tablet prior to flying. 10 tablet 0   escitalopram (LEXAPRO) 10 MG tablet Take 1 tablet (10 mg total) by mouth daily. 90 tablet 3   Multiple Vitamins-Minerals (ICAPS AREDS FORMULA PO) Take by mouth.     STELARA 90 MG/ML SOSY injection Inject into the skin. (Patient not taking: Reported on 10/20/2022)     tirzepatide (ZEPBOUND) 10 MG/0.5ML Pen Inject 10 mg into the skin once a week. 2 mL 0   tirzepatide (ZEPBOUND) 12.5 MG/0.5ML Pen Inject 12.5 mg into the skin once a week. 2 mL 0   No facility-administered medications prior to visit.  Allergies  Allergen Reactions   Aspirin Swelling   Desvenlafaxine Rash   Penicillins Rash   Phentermine Rash   Objective:   Today's Vitals   10/20/22 0751  BP: 112/66  Pulse: 72  Temp: (!) 97.1 F (36.2 C)  TempSrc: Temporal  SpO2: 97%  Weight: 202 lb 3.2 oz (91.7 kg)  Height: '5\' 4"'$  (1.626 m)   Body mass index is 34.71 kg/m.   General: Well developed, well nourished. No acute distress. Extremities: Surgical scar is healing very well. No sign of erythema or rash. Psych: Alert and oriented. Normal mood and affect.  Health Maintenance Due  Topic Date Due   Hepatitis C Screening  Never done   MAMMOGRAM  Never done   DEXA SCAN  Never done     Assessment & Plan:   Problem List Items Addressed This Visit       Musculoskeletal and  Integument   De Quervain's tenosynovitis, right    Recent surgery has corrected the issue.        Other   Class 2 obesity due to excess calories with body mass index (BMI) of 37.0 to 37.9 in adult - Primary    Maximum weight: 234  lbs (11/14/2021) Current weight: 202 lbs Weight change since last visit: -3 lbs Total weight loss: 22 lbs (9.4%)  Patient plans to switch to compounded semaglutide. I recommend that she continue to come in every 3 months for monitoring of her weight loss.       Return in about 3 months (around 01/20/2023) for Reassessment.   Haydee Salter, MD

## 2022-10-20 NOTE — Assessment & Plan Note (Signed)
Recent surgery has corrected the issue.

## 2022-11-30 ENCOUNTER — Encounter: Payer: Self-pay | Admitting: *Deleted

## 2023-02-01 ENCOUNTER — Ambulatory Visit: Payer: No Typology Code available for payment source | Admitting: Family Medicine

## 2023-02-01 VITALS — BP 106/60 | HR 71 | Temp 97.7°F | Resp 16 | Ht 64.0 in | Wt 194.6 lb

## 2023-02-01 DIAGNOSIS — F3340 Major depressive disorder, recurrent, in remission, unspecified: Secondary | ICD-10-CM | POA: Diagnosis not present

## 2023-02-01 DIAGNOSIS — Z131 Encounter for screening for diabetes mellitus: Secondary | ICD-10-CM

## 2023-02-01 DIAGNOSIS — E6609 Other obesity due to excess calories: Secondary | ICD-10-CM

## 2023-02-01 DIAGNOSIS — Z6833 Body mass index (BMI) 33.0-33.9, adult: Secondary | ICD-10-CM | POA: Diagnosis not present

## 2023-02-01 DIAGNOSIS — E782 Mixed hyperlipidemia: Secondary | ICD-10-CM

## 2023-02-01 DIAGNOSIS — Z1159 Encounter for screening for other viral diseases: Secondary | ICD-10-CM

## 2023-02-01 LAB — LIPID PANEL
Cholesterol: 187 mg/dL (ref 0–200)
HDL: 52.9 mg/dL (ref >39.00)
LDL Cholesterol: 97 mg/dL (ref 0–99)
NonHDL: 134.57
Total CHOL/HDL Ratio: 4
Triglycerides: 187 mg/dL — ABNORMAL HIGH (ref 0.0–149.0)
VLDL: 37.4 mg/dL (ref 0.0–40.0)

## 2023-02-01 LAB — HEMOGLOBIN A1C: Hgb A1c MFr Bld: 5 % (ref 4.6–6.5)

## 2023-02-01 LAB — GLUCOSE, RANDOM: Glucose, Bld: 78 mg/dL (ref 70–99)

## 2023-02-01 NOTE — Assessment & Plan Note (Signed)
We will check annual lipids today.

## 2023-02-01 NOTE — Progress Notes (Signed)
[facility] [address] [place] [address] Dept: [phone] Dept Fax: [phone]  Chronic Care Office Visit  Subjective:    Patient ID: Caitlin Pitts, female    DOB: [birth year], 67 y.o..   MRN: [id number]  Chief Complaint  Patient presents with   Weight Management Screening    On semaglutide    History of Present Illness:  Patient is in today for reassessment of chronic medical issues.  Ms. Bertone has a history of obesity. She had been on semaglutide [facility]), then tirzepatide (Zepbound), but lost insurance coverage. She is now on compounded semaglutide through an Personnel officer.  This is still working well for her. She has minor nausea, but is otherwise doing well.  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 03/22/2020   Class 1 obesity due to excess calories with body mass index (BMI) of 33.0 to 33.9 in adult 05/12/2019   Degenerative tear of medial meniscus 03/20/2019   Elevated lipase 10/28/2015   Major depression 10/28/2015   Psoriasis 10/28/2015   Hyperlipidemia 10/28/2015   Past Surgical History:  Procedure Laterality Date   CHOLECYSTECTOMY, LAPAROSCOPIC     EYE SURGERY Bilateral 05/21/2021   FOOT SURGERY     HAND SURGERY     Family History  Problem Relation Age of Onset   Migraines Mother    Hypercholesterolemia Mother    Cancer Father        Lung   Heart disease Maternal Aunt    Stroke Maternal Aunt    Cancer Maternal Aunt        Colon   Cancer Maternal Grandmother        lungcancer   Cancer Paternal Grandmother        colon cancer   Cancer Paternal Grandfather        Colon   Outpatient Medications Prior to Visit  Medication Sig Dispense Refill   buPROPion (WELLBUTRIN XL) 150 MG 24 hr tablet Take 1 tablet (150 mg total) by  mouth daily. 90 tablet 3   calcipotriene (DOVONOX) 0.005 % cream APPLY CREAM TOPICALLY TWICE DAILY     cholecalciferol (VITAMIN D3) 25 MCG (1000 UNIT) tablet Take 5,000 Units by mouth daily.     diazepam (VALIUM) 2 MG tablet Take 1 tablet prior to flying. 10 tablet 0   escitalopram (LEXAPRO) 10 MG tablet Take 1 tablet (10 mg total) by mouth daily. 90 tablet 3   Multiple Vitamins-Minerals (ICAPS AREDS FORMULA PO) Take by mouth.     STELARA 90 MG/ML SOSY injection Inject into the skin. (Patient not taking: Reported on 10/20/2022)     No facility-administered medications prior to visit.   Allergies  Allergen Reactions   Aspirin Swelling   Desvenlafaxine Rash   Penicillins Rash   Phentermine Rash   Objective:   Today's Vitals   02/01/23 0809  BP: 106/60  Pulse: 71  Resp: 16  Temp: 97.7 F (36.5 C)  TempSrc: Oral  SpO2: 99%  Weight: 194 lb 9.6 oz (88.3 kg)  Height: 5\' 4"  (1.626 m)   Body mass index is 33.4 kg/m.   General: Well developed, well nourished. No acute distress. Psych: Alert and oriented. Normal mood and affect.  Health Maintenance Due  Topic Date Due   Hepatitis C Screening  Never done   MAMMOGRAM  Never  done   DEXA SCAN  Never done   COVID-19 Vaccine (4 - 2023-24 season) 04/17/2022     Assessment & Plan:   Problem List Items Addressed This Visit       Other   Major depression    Stable. Continue bupropion XL 150 mg daily and escitalopram 10 mg daily.      Hyperlipidemia    We will check annual lipids today.      Relevant Orders   Lipid panel   Class 1 obesity due to excess calories with body mass index (BMI) of 33.0 to 33.9 in adult - Primary    Maximum weight: 234  lbs (11/14/2021) Current weight: 194 lbs Weight change since last visit: -8 lbs Total weight loss: 40 lbs (17.1%)  Managed on compounded semaglutide. I recommend we recheck on this in 6 months.      Relevant Orders   Hemoglobin A1c   Glucose, random   Other Visit Diagnoses      Encounter for hepatitis C screening test for low risk patient       Relevant Orders   HCV Ab w Reflex to Quant PCR   Screening for diabetes mellitus (DM)       Relevant Orders   Hemoglobin A1c   Glucose, random       Return in about 6 months (around 08/03/2023) for Reassessment.   Loyola Mast, MD

## 2023-02-01 NOTE — Assessment & Plan Note (Signed)
Maximum weight: 234  lbs (11/14/2021) Current weight: 194 lbs Weight change since last visit: -8 lbs Total weight loss: 40 lbs (17.1%)  Managed on compounded semaglutide. I recommend we recheck on this in 6 months.

## 2023-02-01 NOTE — Assessment & Plan Note (Signed)
Stable. Continue bupropion XL 150 mg daily and escitalopram 10 mg daily.

## 2023-02-02 LAB — HCV INTERPRETATION

## 2023-02-02 LAB — HCV AB W REFLEX TO QUANT PCR: HCV Ab: NONREACTIVE

## 2023-03-18 ENCOUNTER — Telehealth: Payer: Self-pay

## 2023-03-18 DIAGNOSIS — Z1231 Encounter for screening mammogram for malignant neoplasm of breast: Secondary | ICD-10-CM

## 2023-03-18 NOTE — Telephone Encounter (Signed)
Called to get pt scheduled the MM. Pt declined to provide DOB and asked, "why are you calling?" After providing reason for call, patient states she has a mammo scheduled with her gynecologist in October. Asked pt if she would send [place] the records once completed.

## 2023-05-18 DIAGNOSIS — L409 Psoriasis, unspecified: Secondary | ICD-10-CM | POA: Diagnosis not present

## 2023-05-18 DIAGNOSIS — L400 Psoriasis vulgaris: Secondary | ICD-10-CM | POA: Diagnosis not present

## 2023-05-18 DIAGNOSIS — Z79899 Other long term (current) drug therapy: Secondary | ICD-10-CM | POA: Diagnosis not present

## 2023-06-01 DIAGNOSIS — Z1231 Encounter for screening mammogram for malignant neoplasm of breast: Secondary | ICD-10-CM | POA: Diagnosis not present

## 2023-06-01 LAB — HM PAP SMEAR

## 2023-06-01 LAB — HM MAMMOGRAPHY

## 2023-06-01 LAB — HM DEXA SCAN

## 2023-08-03 ENCOUNTER — Ambulatory Visit: Payer: Medicare Other | Admitting: Family Medicine

## 2023-08-06 ENCOUNTER — Telehealth: Payer: Self-pay

## 2023-08-06 ENCOUNTER — Encounter: Payer: Self-pay | Admitting: Family Medicine

## 2023-08-06 ENCOUNTER — Ambulatory Visit: Payer: Medicare Other | Admitting: Family Medicine

## 2023-08-06 VITALS — BP 122/70 | HR 92 | Temp 97.9°F | Ht 64.0 in | Wt 191.4 lb

## 2023-08-06 DIAGNOSIS — F3340 Major depressive disorder, recurrent, in remission, unspecified: Secondary | ICD-10-CM | POA: Diagnosis not present

## 2023-08-06 DIAGNOSIS — L409 Psoriasis, unspecified: Secondary | ICD-10-CM | POA: Diagnosis not present

## 2023-08-06 DIAGNOSIS — Z0000 Encounter for general adult medical examination without abnormal findings: Secondary | ICD-10-CM

## 2023-08-06 DIAGNOSIS — F5102 Adjustment insomnia: Secondary | ICD-10-CM | POA: Diagnosis not present

## 2023-08-06 DIAGNOSIS — Z23 Encounter for immunization: Secondary | ICD-10-CM

## 2023-08-06 MED ORDER — TRAZODONE HCL 50 MG PO TABS: 25.0000 mg | ORAL_TABLET | Freq: Every evening | ORAL | 3 refills | Status: AC | PRN

## 2023-08-06 NOTE — Patient Instructions (Signed)
Visit Information  Thank you for taking time to visit with me today. Please don't hesitate to contact me if I can be of assistance to you.   Following are the goals we discussed today:   Goals Addressed             This Visit's Progress    COMPLETED: Care Coordination Activities-no follow up required       Care Coordination Interventions: Discussed services and support. Assessed SDOH. Advised to discuss with primary care physician if services needed in the future.        If you are experiencing a Mental Health or Behavioral Health Crisis or need someone to talk to, please call the Suicide and Crisis Lifeline: 988   Patient verbalizes understanding of instructions and care plan provided today and agrees to view in MyChart. Active MyChart status and patient understanding of how to access instructions and care plan via MyChart confirmed with patient.     The patient has been provided with contact information for the care management team and has been advised to call with any health related questions or concerns.   Bary Leriche, RN, MSN RN Care Manager [facility], Population Health Direct Dial: [phone]  Fax: [phone] Website: [email or web address]

## 2023-08-06 NOTE — Progress Notes (Signed)
[facility] [address] [place] [address] Dept: [phone] Dept Fax: [phone]  Annual Physical Visit  Subjective:    Patient ID: Caitlin Pitts, female    DOB: 30-Jul-1956, 67 y.o..   MRN: [id number]  Chief Complaint  Patient presents with   Annual Exam    CPE/labs.  Fasting today.  Co having sleeping issues x 45 days.    History of Present Illness:  Patient is in today for an annual physical/preventative visit.  Ms. Clelland has a history of obesity. She had been on semaglutide [facility]), then tirzepatide (Zepbound), but lost insurance coverage. She is now on compounded semaglutide through an Personnel officer.  This is still working well for her.   Review of Systems  Constitutional:  Negative for chills, diaphoresis, fever, malaise/fatigue and weight loss.  HENT:  Negative for congestion, ear pain, hearing loss, sinus pain, sore throat and tinnitus.   Eyes:  Negative for blurred vision, pain, discharge and redness.  Respiratory:  Negative for cough, shortness of breath and wheezing.   Cardiovascular:  Negative for chest pain and palpitations.  Gastrointestinal:  Negative for abdominal pain, constipation, diarrhea, heartburn, nausea and vomiting.  Musculoskeletal:  Negative for back pain, joint pain and myalgias.  Skin:  Negative for itching and rash.  Psychiatric/Behavioral:  Positive for depression. The patient is nervous/anxious and has insomnia.        Has had increased anxiety/stressors since the election. Discussed issues this has created in her family. Is finding sleep more difficult at times.   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   Posterior tibial tendinitis of left lower extremity 03/22/2020   Class 1 obesity due to excess calories with body mass index (BMI) of 32.0 to 32.9 in adult 05/12/2019   Degenerative tear of medial meniscus 03/20/2019    Elevated lipase 10/28/2015   Major depression 10/28/2015   Psoriasis 10/28/2015   Hyperlipidemia 10/28/2015   Past Surgical History:  Procedure Laterality Date   CHOLECYSTECTOMY     CHOLECYSTECTOMY, LAPAROSCOPIC     EYE SURGERY Bilateral 05/21/2021   FOOT SURGERY     HAND SURGERY     Family History  Problem Relation Age of Onset   Migraines Mother    Hypercholesterolemia Mother    Depression Mother    Cancer Father        Lung   Heart disease Maternal Aunt    Stroke Maternal Aunt    Cancer Maternal Aunt        Colon   Cancer Maternal Grandmother        lungcancer   Cancer Paternal Grandmother        colon cancer   Cancer Paternal Grandfather        Colon   Outpatient Medications Prior to Visit  Medication Sig Dispense Refill   buPROPion (WELLBUTRIN XL) 150 MG 24 hr tablet Take 1 tablet (150 mg total) by mouth daily. 90 tablet 3   calcipotriene (DOVONOX) 0.005 % cream APPLY CREAM TOPICALLY TWICE DAILY     diazepam (VALIUM) 2 MG tablet Take 1 tablet prior to flying. 10 tablet 0   escitalopram (LEXAPRO) 10 MG tablet Take 1 tablet (10 mg total) by mouth daily. 90 tablet 3   HUMIRA, 2 PEN, 40 MG/0.4ML pen SMARTSIG:40 Milligram(s) SUB-Q Every 2 Weeks     Multiple Vitamins-Minerals (ICAPS AREDS FORMULA PO) Take by mouth.  cholecalciferol (VITAMIN D3) 25 MCG (1000 UNIT) tablet Take 5,000 Units by mouth daily. (Patient not taking: Reported on 08/06/2023)     STELARA 90 MG/ML SOSY injection Inject into the skin. (Patient not taking: Reported on 10/20/2022)     No facility-administered medications prior to visit.   Allergies  Allergen Reactions   Aspirin Swelling   Desvenlafaxine Rash   Penicillins Rash   Phentermine Rash   Objective:   Today's Vitals   08/06/23 1027  BP: 122/70  Pulse: 92  Temp: 97.9 F (36.6 C)  TempSrc: Temporal  SpO2: 99%  Weight: 191 lb 6.4 oz (86.8 kg)  Height: 5\' 4"  (1.626 m)   Body mass index is 32.85 kg/m.   General: Well  developed, well nourished. No acute distress. HEENT: Normocephalic, non-traumatic. PERRL, EOMI. Conjunctiva clear. External ears normal. EAC and   TMs normal bilaterally. Nose clear without congestion or rhinorrhea. Mucous membranes moist. Oropharynx   clear. Good dentition. Neck: Supple. No lymphadenopathy. No thyromegaly. Lungs: Clear to auscultation bilaterally. No wheezing, rales or rhonchi. CV: RRR without murmurs or rubs. Pulses 2+ bilaterally. Abdomen: Soft, non-tender. Bowel sounds positive, normal pitch and frequency. No hepatosplenomegaly.   No rebound or guarding. Back: Straight. No CVA tenderness bilaterally. Extremities: Full ROM. No joint swelling or tenderness. No edema noted. Skin: Warm and dry. There are mildly red, slightly scaly patches on the arms and lower legs. Psych: Alert and oriented. Normal mood and affect.  Health Maintenance Due  Topic Date Due   Medicare Annual Wellness (AWV)  Never done   Lung Cancer Screening  Never done   DEXA SCAN  Never done   INFLUENZA VACCINE  03/18/2023     Assessment & Plan:   Problem List Items Addressed This Visit       Musculoskeletal and Integument   Psoriasis   Stable. Continue Humira and working with dermatology.        Other   Major depression   Continue bupropion XL 150 mg daily and escitalopram 10 mg daily.      Relevant Medications   traZODone (DESYREL) 50 MG tablet   Other Visit Diagnoses       Annual physical exam    -  Primary   Overall good health. Discussed recommended screenings and immunizations.     Adjustment insomnia       I will try her on trazodone to see if this helps with sleep. We also discussed other approaches to reduceing current stressors.   Relevant Medications   traZODone (DESYREL) 50 MG tablet     Need for immunization against influenza       Relevant Orders   Flu Vaccine Trivalent High Dose (Fluad) (Completed)       Return in about 6 months (around 02/04/2024) for  Reassessment.   Loyola Mast, MD

## 2023-08-06 NOTE — Patient Outreach (Signed)
  Care Coordination   In Person Provider Office Visit Note   08/06/2023 Name: Caitlin Pitts MRN: [id number] DOB: 03-Nov-1955  Caitlin Pitts is a 67 y.o. year old female who sees Rudd, Bertram Millard, MD for primary care. I engaged with Gwynneth Albright in the providers office today.  What matters to the patients health and wellness today?  N/a    Goals Addressed             This Visit's Progress    COMPLETED: Care Coordination Activities-no follow up required       Care Coordination Interventions: Discussed services and support. Assessed SDOH. Advised to discuss with primary care physician if services needed in the future.        SDOH assessments and interventions completed:  Yes     Care Coordination Interventions:  Yes, provided   Follow up plan: No further intervention required.   Encounter Outcome:  Patient Visit Completed   Bary Leriche, RN, MSN RN Care Manager [facility], Population Health Direct Dial: [phone]  Fax: [phone] Website: [email or web address]

## 2023-08-06 NOTE — Assessment & Plan Note (Signed)
Continue bupropion XL 150 mg daily and escitalopram 10 mg daily.

## 2023-08-06 NOTE — Assessment & Plan Note (Signed)
Stable. Continue Humira and working with dermatology.

## 2023-09-09 ENCOUNTER — Other Ambulatory Visit: Payer: Self-pay | Admitting: Family Medicine

## 2023-09-09 DIAGNOSIS — F3340 Major depressive disorder, recurrent, in remission, unspecified: Secondary | ICD-10-CM

## 2023-09-21 DIAGNOSIS — L400 Psoriasis vulgaris: Secondary | ICD-10-CM | POA: Diagnosis not present

## 2023-09-24 ENCOUNTER — Telehealth: Payer: Medicare Other | Admitting: Physician Assistant

## 2023-09-24 DIAGNOSIS — J019 Acute sinusitis, unspecified: Secondary | ICD-10-CM

## 2023-09-24 DIAGNOSIS — B9689 Other specified bacterial agents as the cause of diseases classified elsewhere: Secondary | ICD-10-CM

## 2023-09-24 DIAGNOSIS — L400 Psoriasis vulgaris: Secondary | ICD-10-CM | POA: Diagnosis not present

## 2023-09-24 DIAGNOSIS — Z79899 Other long term (current) drug therapy: Secondary | ICD-10-CM | POA: Diagnosis not present

## 2023-09-24 MED ORDER — DOXYCYCLINE HYCLATE 100 MG PO TABS: 100.0000 mg | ORAL_TABLET | Freq: Two times a day (BID) | ORAL | 0 refills | Status: AC

## 2023-09-24 NOTE — Progress Notes (Signed)

## 2023-10-21 DIAGNOSIS — L400 Psoriasis vulgaris: Secondary | ICD-10-CM | POA: Diagnosis not present

## 2023-10-22 DIAGNOSIS — L409 Psoriasis, unspecified: Secondary | ICD-10-CM | POA: Diagnosis not present

## 2023-10-22 DIAGNOSIS — Z79899 Other long term (current) drug therapy: Secondary | ICD-10-CM | POA: Diagnosis not present

## 2023-11-30 DIAGNOSIS — L409 Psoriasis, unspecified: Secondary | ICD-10-CM | POA: Diagnosis not present

## 2023-11-30 DIAGNOSIS — Z79899 Other long term (current) drug therapy: Secondary | ICD-10-CM | POA: Diagnosis not present

## 2024-02-07 ENCOUNTER — Ambulatory Visit: Admitting: Family Medicine

## 2024-02-22 DIAGNOSIS — L400 Psoriasis vulgaris: Secondary | ICD-10-CM | POA: Diagnosis not present

## 2024-02-22 DIAGNOSIS — Z79899 Other long term (current) drug therapy: Secondary | ICD-10-CM | POA: Diagnosis not present

## 2024-03-20 ENCOUNTER — Ambulatory Visit: Admitting: Family Medicine

## 2024-03-24 ENCOUNTER — Ambulatory Visit: Admitting: Family Medicine

## 2024-05-15 ENCOUNTER — Ambulatory Visit: Admitting: Family Medicine

## 2024-05-26 DIAGNOSIS — H04123 Dry eye syndrome of bilateral lacrimal glands: Secondary | ICD-10-CM | POA: Diagnosis not present

## 2024-05-26 DIAGNOSIS — H353131 Nonexudative age-related macular degeneration, bilateral, early dry stage: Secondary | ICD-10-CM | POA: Diagnosis not present

## 2024-06-09 ENCOUNTER — Other Ambulatory Visit: Payer: Self-pay | Admitting: Medical Genetics

## 2024-06-15 ENCOUNTER — Ambulatory Visit (INDEPENDENT_AMBULATORY_CARE_PROVIDER_SITE_OTHER): Admitting: Family Medicine

## 2024-06-15 VITALS — BP 110/68 | HR 80 | Temp 97.8°F | Ht 64.0 in | Wt 199.0 lb

## 2024-06-15 DIAGNOSIS — L659 Nonscarring hair loss, unspecified: Secondary | ICD-10-CM

## 2024-06-15 DIAGNOSIS — F3340 Major depressive disorder, recurrent, in remission, unspecified: Secondary | ICD-10-CM | POA: Diagnosis not present

## 2024-06-15 DIAGNOSIS — L409 Psoriasis, unspecified: Secondary | ICD-10-CM | POA: Diagnosis not present

## 2024-06-15 DIAGNOSIS — E782 Mixed hyperlipidemia: Secondary | ICD-10-CM

## 2024-06-15 DIAGNOSIS — Z87891 Personal history of nicotine dependence: Secondary | ICD-10-CM | POA: Insufficient documentation

## 2024-06-15 LAB — COMPREHENSIVE METABOLIC PANEL WITH GFR
ALT: 11 U/L (ref 0–35)
AST: 16 U/L (ref 0–37)
Albumin: 4.4 g/dL (ref 3.5–5.2)
Alkaline Phosphatase: 56 U/L (ref 39–117)
BUN: 17 mg/dL (ref 6–23)
CO2: 27 meq/L (ref 19–32)
Calcium: 9.6 mg/dL (ref 8.4–10.5)
Chloride: 105 meq/L (ref 96–112)
Creatinine, Ser: 1.24 mg/dL — ABNORMAL HIGH (ref 0.40–1.20)
GFR: 44.8 mL/min — ABNORMAL LOW (ref >60.00)
Glucose, Bld: 83 mg/dL (ref 70–99)
Potassium: 4.8 meq/L (ref 3.5–5.1)
Sodium: 139 meq/L (ref 135–145)
Total Bilirubin: 0.5 mg/dL (ref 0.2–1.2)
Total Protein: 7.6 g/dL (ref 6.0–8.3)

## 2024-06-15 LAB — TSH: TSH: 1.99 u[IU]/mL (ref 0.35–5.50)

## 2024-06-15 LAB — LIPID PANEL
Cholesterol: 275 mg/dL — ABNORMAL HIGH (ref 0–200)
HDL: 67 mg/dL (ref >39.00)
LDL Cholesterol: 169 mg/dL — ABNORMAL HIGH (ref 0–99)
NonHDL: 208.16
Total CHOL/HDL Ratio: 4
Triglycerides: 197 mg/dL — ABNORMAL HIGH (ref 0.0–149.0)
VLDL: 39.4 mg/dL (ref 0.0–40.0)

## 2024-06-15 MED ORDER — ESCITALOPRAM OXALATE 10 MG PO TABS: 10.0000 mg | ORAL_TABLET | Freq: Every day | ORAL | 2 refills | Status: AC

## 2024-06-15 MED ORDER — BUPROPION HCL ER (XL) 150 MG PO TB24: 150.0000 mg | ORAL_TABLET | Freq: Every day | ORAL | 2 refills | Status: AC

## 2024-06-15 NOTE — Assessment & Plan Note (Signed)
 Maximum weight: 234  lbs (11/14/2021) Current weight: 199 lbs Weight change since last visit: + 4 lbs Total weight loss: - 35 lbs (15 %)  Maintain weight loss overall at this point.

## 2024-06-15 NOTE — Assessment & Plan Note (Signed)
Stable. Continue bupropion XL 150 mg daily and escitalopram 10 mg daily.

## 2024-06-15 NOTE — Assessment & Plan Note (Signed)
 Stable. Continue ustekinumab Harles) and working with dermatology.

## 2024-06-15 NOTE — Progress Notes (Signed)
 [facility] [address] [place] [address] Dept: [phone] Dept Fax: [phone]  Chronic Care Office Visit  Subjective:    Patient ID: Caitlin Pitts, female    DOB: Aug 01, 1956, 68 y.o..   MRN: [id number]  Chief Complaint  Patient presents with   Follow-up    6-month follow up. Concerns are dry eyes and dry mouth, and hair loss.   History of Present Illness:  Patient is in today for reassessment of chronic medical issues.  Ms. Pujol has a history of psoriasis. She is working with dermatology. She had previously been on ustekinumab (Stelara). She has now been switched to the biosimilar agent, Yesintek. She feels her psoriasis is overall in good control.  Ms. Taitano has a history of morbid obesity with a prior maximum BMI of 40.2. She was being treated with various GLP-1, which did help her lose weight.  Ms. Willetts has a history of major depression. She is managed on bupropion  XL 150 mg daily and escitalopram  10 mg daily. She feels her mood is stable. She has had increased anxiety related to current political issues. She does make efforts to reduce polarizing social media feeds.  Ms. Boeder has a 25 pack-year history of tobacco use. She quit 6 years ago.  Ms. Kolinski has noted an issue with dry eyes/dry mouth and hair loss/thinning.  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 03/22/2020   Class 1 obesity due to excess calories with body mass index (BMI) of 32.0 to 32.9 in adult 05/12/2019   Degenerative tear of medial meniscus 03/20/2019   Elevated lipase 10/28/2015   Major depression 10/28/2015   Psoriasis 10/28/2015   Hyperlipidemia 10/28/2015   Past Surgical History:  Procedure Laterality Date   CHOLECYSTECTOMY     CHOLECYSTECTOMY, LAPAROSCOPIC      EYE SURGERY Bilateral 05/21/2021   FOOT SURGERY     HAND SURGERY     Family History  Problem Relation Age of Onset   Migraines Mother    Hypercholesterolemia Mother    Depression Mother    Cancer Father        Lung   Heart disease Maternal Aunt    Stroke Maternal Aunt    Cancer Maternal Aunt        Colon   Cancer Maternal Grandmother        lungcancer   Cancer Paternal Grandmother        colon cancer   Cancer Paternal Grandfather        Colon   Outpatient Medications Prior to Visit  Medication Sig Dispense Refill   calcipotriene (DOVONOX) 0.005 % cream APPLY CREAM TOPICALLY TWICE DAILY     diazepam  (VALIUM ) 2 MG tablet Take 1 tablet prior to flying. 10 tablet 0   Multiple Vitamins-Minerals (ICAPS AREDS FORMULA PO) Take by mouth.     traZODone  (DESYREL ) 50 MG tablet Take 0.5-1 tablets (25-50 mg total) by mouth at bedtime as needed for sleep. 30 tablet 3   YESINTEK 45 MG/0.5ML SOSY      buPROPion  (WELLBUTRIN  XL) 150 MG 24 hr tablet Take 1 tablet by mouth once daily 90 tablet 2   escitalopram  (LEXAPRO ) 10 MG tablet Take 1 tablet by mouth once daily 90 tablet 2   doxycycline  (VIBRA -TABS) 100 MG tablet Take 1 tablet (100  mg total) by mouth 2 (two) times daily. (Patient not taking: Reported on 06/15/2024) 20 tablet 0   No facility-administered medications prior to visit.   Allergies  Allergen Reactions   Aspirin Swelling   Desvenlafaxine Rash   Penicillins Rash   Phentermine Rash   Objective:   Today's Vitals   06/15/24 0838  BP: 110/68  Pulse: 80  Temp: 97.8 F (36.6 C)  TempSrc: Temporal  SpO2: 97%  Weight: 199 lb (90.3 kg)  Height: 5' 4 (1.626 m)   Body mass index is 34.16 kg/m.   General: Well developed, well nourished. No acute distress. Scalp: The hair  is general fine in texture. There is some thinning along the part and at the forehead where the   part begins. No areas of alopecia noted. Psych: Alert and oriented. Normal mood and affect.  Health  Maintenance Due  Topic Date Due   Medicare Annual Wellness (AWV)  Never done   Lung Cancer Screening  Never done   COVID-19 Vaccine (4 - 2025-26 season) 04/17/2024     Assessment & Plan:   Problem List Items Addressed This Visit       Musculoskeletal and Integument   Psoriasis - Primary   Stable. Continue ustekinumab Harles) and working with dermatology.      Relevant Orders   Comprehensive metabolic panel with GFR     Other   Former moderate cigarette smoker (10-19 per day)   Discussed LDCT for lung cancer screening. Ms. Rinks is agreeable to pursue this.      Relevant Orders   CT CHEST LUNG CANCER SCREENING LOW DOSE WO CONTRAST   Hyperlipidemia   We will check annual lipids today.      Relevant Orders   Lipid panel   Major depression   Stable. Continue bupropion  XL 150 mg daily and escitalopram  10 mg daily.      Relevant Medications   escitalopram  (LEXAPRO ) 10 MG tablet   buPROPion  (WELLBUTRIN  XL) 150 MG 24 hr tablet   Morbid obesity- Max BMI= 40.28   Maximum weight: 234  lbs (11/14/2021) Current weight: 199 lbs Weight change since last visit: + 4 lbs Total weight loss: - 35 lbs (15 %)  Maintain weight loss overall at this point.      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 I will cehck labs to screen for other possiel secondary causes.   Relevant Orders   Comprehensive metabolic panel with GFR   TSH       Return in about 6 months (around 12/14/2024) for Reassessment.   Garnette CHRISTELLA Simpler, MD

## 2024-06-15 NOTE — Assessment & Plan Note (Signed)
We will check annual lipids today.

## 2024-06-15 NOTE — Assessment & Plan Note (Signed)
 Discussed LDCT for lung cancer screening. Ms. Brokaw is agreeable to pursue this.

## 2024-06-16 ENCOUNTER — Ambulatory Visit: Payer: Self-pay | Admitting: Family Medicine

## 2024-06-26 ENCOUNTER — Ambulatory Visit
Admission: RE | Admit: 2024-06-26 | Discharge: 2024-06-26 | Disposition: A | Source: Ambulatory Visit | Attending: Family Medicine

## 2024-06-26 DIAGNOSIS — Z87891 Personal history of nicotine dependence: Secondary | ICD-10-CM

## 2024-07-03 DIAGNOSIS — J439 Emphysema, unspecified: Secondary | ICD-10-CM | POA: Insufficient documentation

## 2024-07-07 ENCOUNTER — Ambulatory Visit
